# Patient Record
Sex: Female | Born: 1995
Health system: Southern US, Academic
[De-identification: ages and names within clinical notes are randomized; demographics above are authoritative.]

## PROBLEM LIST (undated history)

## (undated) ENCOUNTER — Inpatient Hospital Stay: Payer: Self-pay

## (undated) ENCOUNTER — Encounter

## (undated) ENCOUNTER — Ambulatory Visit: Payer: Medicaid (Managed Care)

## (undated) ENCOUNTER — Encounter: Attending: Physician Assistant | Primary: Physician Assistant

## (undated) ENCOUNTER — Telehealth

## (undated) ENCOUNTER — Ambulatory Visit

## (undated) ENCOUNTER — Ambulatory Visit: Attending: Otolaryngology | Primary: Otolaryngology

## (undated) ENCOUNTER — Ambulatory Visit: Payer: Medicaid (Managed Care) | Attending: Physician Assistant | Primary: Physician Assistant

## (undated) ENCOUNTER — Ambulatory Visit: Payer: PRIVATE HEALTH INSURANCE

## (undated) ENCOUNTER — Encounter: Attending: Primary Care | Primary: Primary Care

## (undated) ENCOUNTER — Inpatient Hospital Stay: Payer: PRIVATE HEALTH INSURANCE

## (undated) ENCOUNTER — Ambulatory Visit: Payer: MEDICAID

## (undated) ENCOUNTER — Encounter: Attending: Dermatology | Primary: Dermatology

## (undated) DIAGNOSIS — R011 Cardiac murmur, unspecified: Secondary | ICD-10-CM

## (undated) DIAGNOSIS — D649 Anemia, unspecified: Secondary | ICD-10-CM

## (undated) HISTORY — PX: NO PAST SURGERIES: SHX2092

---
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
## 2014-03-16 NOTE — L&D Delivery Note (Addendum)
Delivery Note  First Stage: Cervidil IOL for BMI >40 on 11/28/14 Active Labor onset: 1841 Augmentation : AROM and Pitocin  Analgesia /Anesthesia intrapartum: Epidural  AROM at 1841 clear fluid  Second Stage: Complete dilation at 2105 Onset of pushing at 2105 FHR second stage: Category 1: 125bmp/moderate variability/+accels/no decels  Delivery of a viable female at 2123 by CNM in ROA position No nuchal cord Cord double clamped after cessation of pulsation, cut by CNM  Third Stage: Placenta delivered via Tomasa Blase intact with 3 VC @ 2131 Placenta disposition: hospital disposal Uterine tone firm / bleeding minimal  Bilateral labial lacerations identified with minimal oozing Anesthesia for repair: Lidocaine and Epidural Repair: 3'0 vicryl rapide Est. Blood Loss (mL): 250  Complications: None  Mom to postpartum.  Baby to Couplet care / Skin to Skin.  Newborn: Birth Weight: 7#4.4oz  Apgar Scores: 7, 9 Feeding planned: breast/bottle  Dr. Dalbert Garnet present  and assisted for repair.   Karena Addison, CNM

## 2014-06-11 LAB — OB RESULTS CONSOLE HGB/HCT, BLOOD
HCT: 34 %
HEMOGLOBIN: 11.7 g/dL

## 2014-06-11 LAB — OB RESULTS CONSOLE RPR: RPR: NONREACTIVE

## 2014-06-11 LAB — OB RESULTS CONSOLE ABO/RH: RH Type: POSITIVE

## 2014-06-11 LAB — OB RESULTS CONSOLE ANTIBODY SCREEN: Antibody Screen: NEGATIVE

## 2014-06-11 LAB — OB RESULTS CONSOLE GC/CHLAMYDIA
Chlamydia: NEGATIVE
Gonorrhea: NEGATIVE

## 2014-06-11 LAB — OB RESULTS CONSOLE PLATELET COUNT: Platelets: 263 10*3/uL

## 2014-07-13 ENCOUNTER — Ambulatory Visit
Admit: 2014-07-13 | Disposition: A | Discharge status: Home / Self Care | Payer: Self-pay | Attending: Advanced Practice Midwife | Admitting: Advanced Practice Midwife

## 2014-07-26 ENCOUNTER — Ambulatory Visit: Payer: Self-pay

## 2014-08-20 ENCOUNTER — Observation Stay
Admission: EM | Admit: 2014-08-20 | Discharge: 2014-08-20 | Disposition: A | Discharge status: Home / Self Care | Payer: Medicaid Other | Attending: Obstetrics and Gynecology | Admitting: Obstetrics and Gynecology

## 2014-08-20 DIAGNOSIS — Z3490 Encounter for supervision of normal pregnancy, unspecified, unspecified trimester: Secondary | ICD-10-CM

## 2014-08-20 DIAGNOSIS — O26892 Other specified pregnancy related conditions, second trimester: Secondary | ICD-10-CM | POA: Diagnosis not present

## 2014-08-20 DIAGNOSIS — Z3A22 22 weeks gestation of pregnancy: Secondary | ICD-10-CM | POA: Diagnosis not present

## 2014-08-20 NOTE — Progress Notes (Signed)
Patient ID: Bethany Velez, female   DOB: 1995-12-19, 19 y.o.   MRN: [id number]  S: G2P0010 at 25+2wks presenting for r/o preterm premature rupture of membranes after gush of fluid today while walking. No further fluid draining since that time. Good FM. Last intercourse 2 days ago. No vaginal bleeding. Is noting an unusual odor as well.  Ceasar Mons: Filed Vitals:   08/20/14 1639  Temp: 97.9 F (36.6 C)  TempSrc: Oral  Resp: 18  Height: 5\' 2"  (1.575 m)    sterile cervical exam: Closed/thick/high/posterior and firm  Sterile speculum exam: No pooling noted. Physiologic discharge noted. Cervix appears closed.  Wet mount by microscopy by physician: No ferning, nitrazine negative. No trichamonads No pseudohyphae No sperm No clue cells noted  NST- None reactive, reassuring for fetal gestation. No contractions noted.  A: 19yo G2P0010 at 25 wks, intact membranes, not in labor P: Discharge home with precautions. Return to clinic as scheduled

## 2014-08-21 ENCOUNTER — Encounter (HOSPITAL_COMMUNITY): Payer: Self-pay

## 2014-09-07 ENCOUNTER — Other Ambulatory Visit: Payer: Self-pay | Admitting: Advanced Practice Midwife

## 2014-09-07 DIAGNOSIS — Z3689 Encounter for other specified antenatal screening: Secondary | ICD-10-CM

## 2014-09-07 DIAGNOSIS — R011 Cardiac murmur, unspecified: Secondary | ICD-10-CM

## 2014-10-01 ENCOUNTER — Ambulatory Visit (HOSPITAL_BASED_OUTPATIENT_CLINIC_OR_DEPARTMENT_OTHER)
Admission: RE | Admit: 2014-10-01 | Discharge: 2014-10-01 | Disposition: A | Discharge status: Home / Self Care | Payer: Medicaid Other | Source: Ambulatory Visit | Attending: Advanced Practice Midwife | Admitting: Advanced Practice Midwife

## 2014-10-01 ENCOUNTER — Ambulatory Visit
Admission: RE | Admit: 2014-10-01 | Discharge: 2014-10-01 | Disposition: A | Discharge status: Home / Self Care | Payer: Medicaid Other | Source: Ambulatory Visit | Attending: Maternal & Fetal Medicine | Admitting: Maternal & Fetal Medicine

## 2014-10-01 DIAGNOSIS — Z6841 Body Mass Index (BMI) 40.0 and over, adult: Secondary | ICD-10-CM | POA: Diagnosis not present

## 2014-10-01 DIAGNOSIS — O359XX0 Maternal care for (suspected) fetal abnormality and damage, unspecified, not applicable or unspecified: Secondary | ICD-10-CM

## 2014-10-01 DIAGNOSIS — Z87891 Personal history of nicotine dependence: Secondary | ICD-10-CM | POA: Diagnosis not present

## 2014-10-01 DIAGNOSIS — O99213 Obesity complicating pregnancy, third trimester: Secondary | ICD-10-CM | POA: Diagnosis not present

## 2014-10-01 DIAGNOSIS — O99210 Obesity complicating pregnancy, unspecified trimester: Secondary | ICD-10-CM

## 2014-10-01 DIAGNOSIS — R011 Cardiac murmur, unspecified: Secondary | ICD-10-CM | POA: Diagnosis not present

## 2014-10-01 DIAGNOSIS — E669 Obesity, unspecified: Secondary | ICD-10-CM | POA: Diagnosis not present

## 2014-10-01 DIAGNOSIS — Z3A31 31 weeks gestation of pregnancy: Secondary | ICD-10-CM | POA: Diagnosis not present

## 2014-10-01 DIAGNOSIS — Z3689 Encounter for other specified antenatal screening: Secondary | ICD-10-CM

## 2014-10-01 HISTORY — DX: Cardiac murmur, unspecified: R01.1

## 2014-10-01 LAB — US OB DETAIL + 14 WK

## 2014-10-01 NOTE — Progress Notes (Signed)
[facility] Consultation   Chief Complaint: Morbid obesity, innocent heart murmur.  HPI: Ms. Bethany Velez is a 19 y.o. G1P0000 at 1810w2d by 8 week 5 day [facility] who presents in consultation from  for history of heart murmur and morbid obesity.  Gynecologic History:  Irregular menses  Past Medical History: Patient  has a past medical history of Anemia and Heart murmur (19 years old). According to the patient [facility] and was told that the heart murmur was not a problem.  Past Surgical History: No operations.  Medications: Prenatal vitamins and iron.   Allergies: Patient has No Known Allergies.   Social History: Patient  reports that she has quit smoking. She has never used smokeless tobacco. She reports that she does not drink alcohol or use illicit drugs.   Family History: Negative  Review of Systems A full 12 point review of systems was negative.   Physical Exam: BP 125/76 Wt = 234 lbs BMI today 42.8  US today - BREECH, EFW 1797 g, 48th %tile, AFI - 15.4 cm  Asessement: 19 yo gravida 1 at 31 weeks 2 days gestation with BMI > 40   Recommendations Now:  Because of her BMI and reported snoring, we would recommend a sleep study.  We have scheduled her to return for follow-up ultrasound for growth in 4-5 weeks.   We would also recommend she establish care with her OBGYN now, rather than awaiting labor.   We would recommend an anesthesia consult.   We would recommend weekly antepartum testing starting at [redacted] weeks gestation and delivery by [redacted] weeks gestation. If she develops preeclampsia or other complication, the intensity of surveillance would need to be increased. At delivery:  We would recommend pneumatic compression devices in labor and postpartum.  We would recommend consideration of 3 gm instead of 2 gm of cefazolin for prophylaxis at the time of a cesarean delivery.  At the time of a cesarean delivery, we  would recommend application of a negative pressure dressing. Postpartum:  We would recommend referral for weight loss management.   Total time spent with the patient was 40 minutes with greater than 50% spent in counseling and coordination of care.  We will be happy to be involved in the ongoing care of Bethany Velez as her team desires.  Argentina PonderAndra H. Ancel Easler, MD [facility]

## 2014-10-01 NOTE — Consult Note (Signed)
[facility] Consultation   Chief Complaint: Morbid obesity, innocent heart murmur.  HPI: Ms. Bethany Velez is a 19 y.o. G1P0000 at [redacted]w[redacted]d by 8 week 5 day ultrasound performed at [facility] who presents in consultation from  for history of heart murmur and morbid obesity.  Gynecologic History:  Irregular menses  Past Medical History: Patient  has a past medical history of Anemia and Heart murmur (19 years old). According to the patient she was seen at [facility] and was told that the heart murmur was not a problem.  Past Surgical History: No operations.  Medications: Prenatal vitamins and iron.   Allergies: Patient has No Known Allergies.   Social History: Patient  reports that she has quit smoking. She has never used smokeless tobacco. She reports that she does not drink alcohol or use illicit drugs.   Family History: Negative  Review of Systems A full 12 point review of systems was negative.   Physical Exam: BP 125/76 Wt = 234 lbs BMI today 42.8  US today - BREECH, EFW 1797 g, 48th %tile, AFI - 15.4 cm  Asessement: 19 yo gravida 1 at 31 weeks 2 days gestation with BMI > 40   Recommendations Now:  Because of her BMI and reported snoring, we would recommend a sleep study.  We have scheduled her to return for follow-up ultrasound for growth in 4-5 weeks.   We would also recommend she establish care with her OBGYN now, rather than awaiting labor.   We would recommend an anesthesia consult.   We would recommend weekly antepartum testing starting at [redacted] weeks gestation and delivery by [redacted] weeks gestation. If she develops preeclampsia or other complication, the intensity of surveillance would need to be increased. At delivery:  We would recommend pneumatic compression devices in labor and postpartum.  We would recommend consideration of 3 gm instead of 2 gm of cefazolin for prophylaxis at the time of a cesarean delivery.  At the time of a cesarean delivery, we  would recommend application of a negative pressure dressing. Postpartum:  We would recommend referral for weight loss management.   Total time spent with the patient was 40 minutes with greater than 50% spent in counseling and coordination of care.  We will be happy to be involved in the ongoing care of Bethany Velez as her team desires.  Baili Stang H. Shahad Mazurek, MD [facility] 

## 2014-10-17 ENCOUNTER — Observation Stay
Admission: EM | Admit: 2014-10-17 | Discharge: 2014-10-17 | Disposition: A | Discharge status: Home / Self Care | Payer: Medicaid Other | Source: Other Acute Inpatient Hospital | Attending: Obstetrics and Gynecology | Admitting: Obstetrics and Gynecology

## 2014-10-17 ENCOUNTER — Encounter: Payer: Self-pay | Admitting: *Deleted

## 2014-10-17 DIAGNOSIS — R102 Pelvic and perineal pain: Secondary | ICD-10-CM | POA: Diagnosis present

## 2014-10-17 DIAGNOSIS — Z3A33 33 weeks gestation of pregnancy: Secondary | ICD-10-CM | POA: Diagnosis not present

## 2014-10-17 DIAGNOSIS — O26893 Other specified pregnancy related conditions, third trimester: Principal | ICD-10-CM | POA: Insufficient documentation

## 2014-10-17 HISTORY — DX: Anemia, unspecified: D64.9

## 2014-10-17 NOTE — OB Triage Note (Signed)
Pt arrived to [facility] with complaint of pressure in her perineal area since yesterday.  Pt states that she called [facility] and they told her to come to [facility] for assessment.  Ellison Carwin RNC

## 2014-10-17 NOTE — Discharge Instructions (Signed)
Braxton Hicks Contractions °Contractions of the uterus can occur throughout pregnancy. Contractions are not always a sign that you are in labor.  °WHAT ARE BRAXTON HICKS CONTRACTIONS?  °Contractions that occur before labor are called Braxton Hicks contractions, or false labor. Toward the end of pregnancy (32-34 weeks), these contractions can develop more often and may become more forceful. This is not true labor because these contractions do not result in opening (dilatation) and thinning of the cervix. They are sometimes difficult to tell apart from true labor because these contractions can be forceful and people have different pain tolerances. You should not feel embarrassed if you go to the hospital with false labor. Sometimes, the only way to tell if you are in true labor is for your health care provider to look for changes in the cervix. °If there are no prenatal problems or other health problems associated with the pregnancy, it is completely safe to be sent home with false labor and await the onset of true labor. °HOW CAN YOU TELL THE DIFFERENCE BETWEEN TRUE AND FALSE LABOR? °False Labor °· The contractions of false labor are usually shorter and not as hard as those of true labor.   °· The contractions are usually irregular.   °· The contractions are often felt in the front of the lower abdomen and in the groin.   °· The contractions may go away when you walk around or change positions while lying down.   °· The contractions get weaker and are shorter lasting as time goes on.   °· The contractions do not usually become progressively stronger, regular, and closer together as with true labor.   °True Labor °· Contractions in true labor last 30-70 seconds, become very regular, usually become more intense, and increase in frequency.   °· The contractions do not go away with walking.   °· The discomfort is usually felt in the top of the uterus and spreads to the lower abdomen and low back.   °· True labor can be  determined by your health care provider with an exam. This will show that the cervix is dilating and getting thinner.   °WHAT TO REMEMBER °· Keep up with your usual exercises and follow other instructions given by your health care provider.   °· Take medicines as directed by your health care provider.   °· Keep your regular prenatal appointments.   °· Eat and drink lightly if you think you are going into labor.   °· If Braxton Hicks contractions are making you uncomfortable:   °¨ Change your position from lying down or resting to walking, or from walking to resting.   °¨ Sit and rest in a tub of warm water.   °¨ Drink 2-3 glasses of water. Dehydration may cause these contractions.   °¨ Do slow and deep breathing several times an hour.   °WHEN SHOULD I SEEK IMMEDIATE MEDICAL CARE? °Seek immediate medical care if: °· Your contractions become stronger, more regular, and closer together.   °· You have fluid leaking or gushing from your vagina.   °· You have a fever.   °· You pass blood-tinged mucus.   °· You have vaginal bleeding.   °· You have continuous abdominal pain.   °· You have low back pain that you never had before.   °· You feel your baby's head pushing down and causing pelvic pressure.   °· Your baby is not moving as much as it used to.   °Document Released: 03/02/2005 Document Revised: 03/07/2013 Document Reviewed: 12/12/2012 °Exit[employer]® Patient Information ©2015 [employer]. This information is not intended to replace advice given to you by your health care   provider. Make sure you discuss any questions you have with your health care provider. ° °

## 2014-10-17 NOTE — OB Triage Provider Note (Signed)
CC: vaginal pressure  LMP: unsure EDD: 12/01/14 by 8.5wk Higinio Roger G1P0 @ 33+4wks here for one day h/o vaginal pressure. No pain, no ctx, no lof, no vb. No dysuria. No other complaints. +FM  APC: [facility] (plans to come to [place] at 36wks) 1. Obesity (BMI 44) - NSTs at 36wk - delivery by 40wks - will need SCD in labor and 3g ancef if CS  2. H/o heart murmur (age 19yo) - [facility] in past - not an issue this pregnancy  Labs:  06/14/14 - hb 10.4,  05/18/14 - antibody neg, A+, RPR nr, GC/CT neg, Ucx neg, HIV neg, GCT 101, HepBsag neg, PL 263 Utox neg  Sono: 04/26/14 - TVUS 8 5/7, 9ovaries wnl,  10/01/14 - BREECH, EFW 1797 g, 48th %tile, AFI - 15.4 cm  Gynecologic History:  Irregular menses  Past Medical History: Patient has a past medical history of Anemia and Heart murmur (18 years old). According to the patient she was seen at [facility] and was told that the heart murmur was not a problem.  Past Surgical History: No operations.  Medications: Prenatal vitamins and iron.   Allergies: Patient has No Known Allergies.   Social History: Patient reports that she has quit smoking. She has never used smokeless tobacco. She reports that she does not drink alcohol or use illicit drugs.   Family History: Negative  Review of Systems A full 12 point review of systems was negative.      PE: BP 118/73 mmHg  Pulse 96  Temp(Src) 98.6 F (37 C) (Oral)  Ht  (1.575 m)  Wt 105.688 kg (233 lb)  BMI 42.61 kg/m2  Physical Exam  Constitutional: She is oriented to person, place, and time. She appears well-developed and well-nourished.  Cardiovascular: Normal rate.   Pulmonary/Chest: Effort normal.  Abdominal: Soft.  Neurological: She is alert and oriented to person, place, and time.  Skin: Skin is warm and dry.  Psychiatric: She has a normal mood and affect. Her behavior is normal.   SVE: external 1cm, internal closed, soft, posterior  EFM: 130 mod var, + accels no decels Toco:  irritable, no discernible ctx  A/P: 19yo G1P0 @ 33+4wks here with vaginal pressure, closed internal cervix, no ctx on toco. Reactive tracing. Patient given strict preterm labor precautions. [facility].  Ala Dach, MD

## 2014-10-29 ENCOUNTER — Ambulatory Visit
Admission: RE | Admit: 2014-10-29 | Discharge: 2014-10-29 | Disposition: A | Discharge status: Home / Self Care | Payer: Medicaid Other | Source: Ambulatory Visit | Attending: Maternal & Fetal Medicine | Admitting: Maternal & Fetal Medicine

## 2014-10-29 DIAGNOSIS — Z3A35 35 weeks gestation of pregnancy: Secondary | ICD-10-CM | POA: Insufficient documentation

## 2014-10-29 DIAGNOSIS — Z3689 Encounter for other specified antenatal screening: Secondary | ICD-10-CM

## 2014-10-29 DIAGNOSIS — O99213 Obesity complicating pregnancy, third trimester: Secondary | ICD-10-CM | POA: Insufficient documentation

## 2014-10-29 DIAGNOSIS — R011 Cardiac murmur, unspecified: Secondary | ICD-10-CM | POA: Insufficient documentation

## 2014-10-29 LAB — US MFM OB FOLLOW UP

## 2014-10-31 DIAGNOSIS — Z8679 Personal history of other diseases of the circulatory system: Secondary | ICD-10-CM | POA: Insufficient documentation

## 2014-11-05 ENCOUNTER — Ambulatory Visit
Admission: RE | Admit: 2014-11-05 | Discharge: 2014-11-05 | Disposition: A | Discharge status: Home / Self Care | Payer: Medicaid Other | Source: Ambulatory Visit | Attending: Maternal & Fetal Medicine | Admitting: Maternal & Fetal Medicine

## 2014-11-05 ENCOUNTER — Ambulatory Visit: Payer: Medicaid Other

## 2014-11-05 DIAGNOSIS — O99213 Obesity complicating pregnancy, third trimester: Secondary | ICD-10-CM | POA: Diagnosis not present

## 2014-11-05 DIAGNOSIS — O99210 Obesity complicating pregnancy, unspecified trimester: Secondary | ICD-10-CM

## 2014-11-05 DIAGNOSIS — Z3A36 36 weeks gestation of pregnancy: Secondary | ICD-10-CM | POA: Insufficient documentation

## 2014-11-05 DIAGNOSIS — Z6841 Body Mass Index (BMI) 40.0 and over, adult: Secondary | ICD-10-CM | POA: Diagnosis not present

## 2014-11-21 ENCOUNTER — Inpatient Hospital Stay: Admission: RE | Admit: 2014-11-21 | Payer: Medicaid Other | Source: Ambulatory Visit

## 2014-11-28 ENCOUNTER — Inpatient Hospital Stay
Admission: RE | Admit: 2014-11-28 | Discharge: 2014-12-01 | DRG: 775 | Disposition: A | Discharge status: Home / Self Care | Payer: Medicaid Other | Attending: Obstetrics and Gynecology | Admitting: Obstetrics and Gynecology

## 2014-11-28 DIAGNOSIS — O99214 Obesity complicating childbirth: Principal | ICD-10-CM | POA: Diagnosis present

## 2014-11-28 DIAGNOSIS — Z3A39 39 weeks gestation of pregnancy: Secondary | ICD-10-CM | POA: Diagnosis present

## 2014-11-28 DIAGNOSIS — Z87891 Personal history of nicotine dependence: Secondary | ICD-10-CM | POA: Diagnosis not present

## 2014-11-28 DIAGNOSIS — O99213 Obesity complicating pregnancy, third trimester: Secondary | ICD-10-CM | POA: Diagnosis present

## 2014-11-28 DIAGNOSIS — Z6841 Body Mass Index (BMI) 40.0 and over, adult: Secondary | ICD-10-CM | POA: Diagnosis not present

## 2014-11-28 MED ORDER — LACTATED RINGERS IV SOLN
500.0000 mL | INTRAVENOUS | Status: DC | PRN
  Administered 2014-11-29: 1000 mL via INTRAVENOUS

## 2014-11-29 ENCOUNTER — Inpatient Hospital Stay: Payer: Medicaid Other | Admitting: Registered Nurse

## 2014-11-29 ENCOUNTER — Encounter: Payer: Self-pay | Admitting: *Deleted

## 2014-11-29 ENCOUNTER — Other Ambulatory Visit: Payer: Self-pay | Admitting: Obstetrics and Gynecology

## 2014-11-29 LAB — CBC
HEMATOCRIT: 34.2 % — AB (ref 35.0–47.0)
Hemoglobin: 11.4 g/dL — ABNORMAL LOW (ref 12.0–16.0)
MCH: 27.5 pg (ref 26.0–34.0)
MCHC: 33.5 g/dL (ref 32.0–36.0)
MCV: 82 fL (ref 80.0–100.0)
Platelets: 223 10*3/uL (ref 150–440)
RBC: 4.17 MIL/uL (ref 3.80–5.20)
RDW: 14.5 % (ref 11.5–14.5)
WBC: 10.6 10*3/uL (ref 3.6–11.0)

## 2014-11-29 LAB — TYPE AND SCREEN
ABO/RH(D): A POS
ANTIBODY SCREEN: NEGATIVE

## 2014-11-29 LAB — CHLAMYDIA/NGC RT PCR (ARMC ONLY)
CHLAMYDIA TR: NOT DETECTED
N GONORRHOEAE: NOT DETECTED

## 2014-11-29 MED ORDER — BUTORPHANOL TARTRATE 1 MG/ML IJ SOLN: 1.0000 mg | INTRAMUSCULAR | Status: DC | PRN

## 2014-11-29 MED ORDER — MISOPROSTOL 200 MCG PO TABS
ORAL_TABLET | ORAL | Status: AC
  Filled 2014-11-29: qty 4

## 2014-11-29 MED ORDER — LACTATED RINGERS IV SOLN
INTRAVENOUS | Status: DC
  Administered 2014-11-29 (×3): via INTRAVENOUS

## 2014-11-29 MED ORDER — OXYTOCIN 40 UNITS IN LACTATED RINGERS INFUSION - SIMPLE MED
1.0000 m[IU]/min | INTRAVENOUS | Status: DC
  Administered 2014-11-29: 1 m[IU]/min via INTRAVENOUS

## 2014-11-29 MED ORDER — ONDANSETRON HCL 4 MG/2ML IJ SOLN
4.0000 mg | Freq: Four times a day (QID) | INTRAMUSCULAR | Status: DC | PRN
  Administered 2014-11-29: 4 mg via INTRAVENOUS
  Filled 2014-11-29: qty 2

## 2014-11-29 MED ORDER — BUTORPHANOL TARTRATE 1 MG/ML IJ SOLN
2.0000 mg | INTRAMUSCULAR | Status: DC | PRN
  Administered 2014-11-29 (×4): 2 mg via INTRAVENOUS
  Filled 2014-11-29 (×3): qty 2

## 2014-11-29 MED ORDER — OXYTOCIN 40 UNITS IN LACTATED RINGERS INFUSION - SIMPLE MED
INTRAVENOUS | Status: AC
  Filled 2014-11-29: qty 1000

## 2014-11-29 MED ORDER — OXYTOCIN 40 UNITS IN LACTATED RINGERS INFUSION - SIMPLE MED: 62.5000 mL/h | INTRAVENOUS | Status: DC

## 2014-11-29 MED ORDER — LACTATED RINGERS IV SOLN: 500.0000 mL | INTRAVENOUS | Status: DC | PRN

## 2014-11-29 MED ORDER — SODIUM CHLORIDE 0.9 % IJ SOLN
INTRAMUSCULAR | Status: AC
  Filled 2014-11-29: qty 10

## 2014-11-29 MED ORDER — ACETAMINOPHEN 325 MG PO TABS: 650.0000 mg | ORAL_TABLET | ORAL | Status: DC | PRN

## 2014-11-29 MED ORDER — LIDOCAINE-EPINEPHRINE (PF) 1.5 %-1:200000 IJ SOLN
INTRAMUSCULAR | Status: DC | PRN
  Administered 2014-11-29: 3 mL via PERINEURAL
  Administered 2014-11-29: 2 mL via PERINEURAL

## 2014-11-29 MED ORDER — BUTORPHANOL TARTRATE 1 MG/ML IJ SOLN
INTRAMUSCULAR | Status: AC
  Administered 2014-11-29: 2 mg via INTRAVENOUS
  Filled 2014-11-29: qty 2

## 2014-11-29 MED ORDER — LIDOCAINE HCL (PF) 1 % IJ SOLN
INTRAMUSCULAR | Status: AC
  Filled 2014-11-29: qty 30

## 2014-11-29 MED ORDER — LIDOCAINE HCL (PF) 1 % IJ SOLN
30.0000 mL | INTRAMUSCULAR | Status: DC | PRN
  Filled 2014-11-29: qty 30

## 2014-11-29 MED ORDER — FENTANYL 2.5 MCG/ML W/ROPIVACAINE 0.2% IN NS 100 ML EPIDURAL INFUSION (ARMC-ANES)
EPIDURAL | Status: AC
  Administered 2014-11-29: 10 mL/h via EPIDURAL
  Filled 2014-11-29: qty 100

## 2014-11-29 MED ORDER — OXYTOCIN BOLUS FROM INFUSION: 500.0000 mL | INTRAVENOUS | Status: DC

## 2014-11-29 MED ORDER — CITRIC ACID-SODIUM CITRATE 334-500 MG/5ML PO SOLN: 30.0000 mL | ORAL | Status: DC | PRN

## 2014-11-29 MED ORDER — DINOPROSTONE 10 MG VA INST
10.0000 mg | VAGINAL_INSERT | Freq: Once | VAGINAL | Status: AC
  Administered 2014-11-29: 10 mg via VAGINAL
  Filled 2014-11-29: qty 1

## 2014-11-29 MED ORDER — BUPIVACAINE HCL (PF) 0.25 % IJ SOLN
INTRAMUSCULAR | Status: DC | PRN
  Administered 2014-11-29 (×2): 4 mL via EPIDURAL

## 2014-11-29 MED ORDER — TERBUTALINE SULFATE 1 MG/ML IJ SOLN: 0.2500 mg | Freq: Once | INTRAMUSCULAR | Status: DC | PRN

## 2014-11-29 MED ORDER — AMMONIA AROMATIC IN INHA
RESPIRATORY_TRACT | Status: AC
  Filled 2014-11-29: qty 10

## 2014-11-29 NOTE — Progress Notes (Signed)
In room to reposition pt, when on left side, epidural level at T9, after inserting foley and repositioning to right side, pt level much higher, c/o not able to get deep breath. Pt repositioned to high fowlers and anesthesia notified. O2 sats 98%, BP 125/54. Per Dr. Pernell Dupre, decrease epidural rate to 1 ml/hour for 30 minutes, then return to 48ml/hour.

## 2014-11-29 NOTE — Progress Notes (Signed)
S:  Cervidil removed per nurse at 12      Ambulating in hall       Receiving intermittent Stadol for pain, rating it 7/10       Pt. Is considering an epidural  O:  VS: Blood pressure 126/69, pulse 109, temperature 97.7 F (36.5 C), temperature source Oral, resp. rate 18, height  (1.575 m), weight 107.049 kg (236 lb), SpO2 98 %.        FHR : baseline 120bmp / variability Moderate / accelerations +15x15 / no decelerations        Toco: contractions every 2-4 minutes / Moderate to palpation        Cervix : Dilation: 4 Effacement (%): 60 Cervical Position: Posterior Station: -3 Presentation: Vertex Exam by:: Joelene Millin, CNM        Membranes: Intact  A: Latent labor     FHR category 1  P: Begin Pitocin augmentation      May have epidural upon request     Reassess in 1-2 hours      Anticipate NSVD   Carlean Jews, CNM

## 2014-11-29 NOTE — Anesthesia Procedure Notes (Signed)
Epidural Patient location during procedure: OB Start time: 11/29/2014 2:48 PM End time: 11/29/2014 3:02 PM  Staffing Resident/CRNA: Stormy Fabian Performed by: resident/CRNA   Preanesthetic Checklist Completed: patient identified, site marked, surgical consent, pre-op evaluation, timeout performed, IV checked, risks and benefits discussed and monitors and equipment checked  Epidural Patient position: sitting Prep: Betadine Patient monitoring: heart rate, continuous pulse ox and blood pressure Approach: midline Location: L4-L5 Injection technique: LOR saline  Needle:  Needle type: Tuohy  Needle gauge: 18 G Needle length: 9 cm and 9 Needle insertion depth: 7 cm Catheter type: closed end flexible Catheter size: 20 Guage Catheter at skin depth: 11 cm Test dose: negative and 1.5% lidocaine with Epi 1:200 K  Assessment Events: blood not aspirated, injection not painful, no injection resistance, negative IV test and no paresthesia  Additional Notes   Patient tolerated the insertion well without complications.Reason for block:procedure for pain

## 2014-11-29 NOTE — Progress Notes (Signed)
S:  Doing well, minimal rest overnight       Rates pain 7/10 with Stadol, but tolerating well / does not want epidural during labor  O:  VS: Blood pressure 117/70, pulse 83, temperature 98.7 F (37.1 C), temperature source Oral, resp. rate 16, height  (1.575 m), weight 107.049 kg (236 lb).       General: alert, oriented x 3, NAD        Lungs: clear, equal bilaterally,         Heart: RRR        Abdomen: obese, gravid, non-tender, +BS        Lower Extremities: no edema, +2DTRs, no clonus        FHR : baseline 125/ variability Moderate / accelerations + 15x15 / no decelerations        Toco: contractions every 2-4 minutes /Quality: Mild-Moderate        Cervix : Dilation: 3 Effacement (%): 60 Cervical Position: Posterior Station: -2 Presentation: Vertex Exam by:: HFC        Membranes: Intact  A: Latent labor     IOL      Obesity     FHR category 1  P: Continue Cervidil induction - plan to remove around 12pm today      Anesthesia consult this morning      Reassess in 2-3 hours      Anticipate NSVD   Karena Addison, CNM

## 2014-11-29 NOTE — Progress Notes (Signed)
S:  Pt. Comfortable with epidural       Had an episode causing her to feel like she couldn't catch her breath - anesthesia notified by RN and adjusted epidural and now feels better        Discussed risks/benefits of AROM and pt. Agrees   O:  VS: Blood pressure 113/61, pulse 102, temperature 98.3 F (36.8 C), temperature source Oral, resp. rate 18, height  (1.575 m), weight 107.049 kg (236 lb), SpO2 96 %.        FHR : baseline 125bmp / variability moderate / accelerations + 15x15 / occasional early/variable decelerations        Toco: contractions every 1.5-2 minutes / Mild-Moderate        Cervix : Dilation: 6 Effacement (%): 80 Cervical Position: Posterior Station: 0 Presentation: Vertex Exam by:: MSigmon        Membranes: AROM @ 1841 small amount of clear fluid        Pitocin at 7 milliunits  A: Active labor     FHR category 2  P: Continue pitocin augmentation      Reassess in 1-2 hours      Anticipate NSVD   Karena Addison, CNM

## 2014-11-29 NOTE — H&P (Signed)
OB PHYSICIAN IOL ADMISSION NOTE  LMP: unsure EDD: 12/01/14 by 8.5wk sono  HPI: 19yo G1P0 @ 39+5wks here for IOL for morbid obesity.   APC: [facility] --> [facility] 1. Obesity BMI 44 - s/p [facility] consult - Weekly NST/AFI starting at 36wks - Monthly growth sonos, last 8/15 - Induction by 40wks  anesthesia consult (need to see in house)  sleep study ([facility] is arranging [facility] notes)  weight loss management referral   2. Abdominal furnacles - s/p I&D  3. H/o heart murmur as child - s/p [facility] cards w/u, nl ECHO 2013  Labs:  3/3 - A+, ab neg, 11.7/34.3<263, RPR NR, GC/CT neg, Ucx neg, HIV neg, GCT 101, Hbsag neg, RI, VZV I Utox neg, GC/CT neg 7/7 - RPR NR, GCT 91, HIV neg,  7/30 - P:C 177, Hb 11.6 8/24 - GC/CT neg, GBS neg, RPR neg 8/26 - Ecoli UTI, s/p treatment and TOC  Sono:  04/26/14 - TVUS 8 5/7, 9ovaries wnl,  10/01/14 - BREECH, EFW 1797 g, 48th %tile, AFI - 15.4 cm 8/15: EFW 2659g (50%), VTX, AFI 10.4, post placenta, 35+[redacted]wks GA, normal anatomy  Gynecologic History:  Irregular menses  Past Medical History: Patient has a past medical history of Anemia and Heart murmur (19 years old). According to the patient she was seen at [facility] and was told that the heart murmur was not a problem.  Past Surgical History: No operations.  Medications: Prenatal vitamins and iron.   Allergies: Patient has No Known Allergies.   Social History: Patient reports that she has quit smoking. She has never used smokeless tobacco. She reports that she does not drink alcohol or use illicit drugs.   Family History: Negative  Review of Systems A full 12 point review of systems was negative.   O: BP 120/78 mmHg  Pulse 89  Temp(Src) 98 F (36.7 C) (Oral)  Resp 16  Ht  (1.575 m)  Wt 107.049 kg (236 lb)  BMI 43.15 kg/m2   EFM: 130 mod var, +accels, no decels Toco: Q11min  A/P: 19yo G1P0 @ 39+5wks here for IOL for morbid obesity.   1. Morbid obesity - anesthesia consult in  morning  2. IOL - cervadil overnight  To be evaluated in the morning  Ala Dach, MD

## 2014-11-29 NOTE — Progress Notes (Signed)
S: Just received epidural and is much more comfortable and planning to take a nap  O:  VS: Blood pressure 126/69, pulse 109, temperature 97.7 F (36.5 C), temperature source Oral, resp. rate 18, height  (1.575 m), weight 107.049 kg (236 lb), SpO2 98 %.        FHR : baseline 135 / variability Moderate / accelerations + 15x15 / no decelerations        Toco: contractions every 2-68minutes / Moderate         Cervix : Dilation: 4 Effacement (%): 60 Cervical Position: Posterior Station: -3 Presentation: Vertex Exam by:: M. Sigmon, CNM        Membranes: Intact        Pitocin at 3 milliunits   A: Latent labor     FHR category 1  P: Continue Pitocin augmentation      Reassess in 1-2 hours      Anticipate NSVD  Carlean Jews, CNM

## 2014-11-29 NOTE — Anesthesia Preprocedure Evaluation (Signed)
Anesthesia Evaluation  Patient identified by MRN, date of birth, ID band Patient awake    Reviewed: Allergy & Precautions, H&P , NPO status , Patient's Chart, lab work & pertinent test results  History of Anesthesia Complications Negative for: history of anesthetic complications  Airway Mallampati: II  TM Distance: >3 FB Neck ROM: full    Dental   Pulmonary former smoker,    Pulmonary exam normal        Cardiovascular Normal cardiovascular exam  Murmur at 19 yrs old   Neuro/Psych negative neurological ROS  negative psych ROS   GI/Hepatic negative GI ROS, Neg liver ROS,   Endo/Other  negative endocrine ROS  Renal/GU negative Renal ROS  negative genitourinary   Musculoskeletal   Abdominal   Peds  Hematology  (+) anemia ,   Anesthesia Other Findings   Reproductive/Obstetrics (+) Pregnancy                             Anesthesia Physical Anesthesia Plan  ASA: II  Anesthesia Plan: Epidural   Post-op Pain Management:    Induction:   Airway Management Planned:   Additional Equipment:   Intra-op Plan:   Post-operative Plan:   Informed Consent: I have reviewed the patients History and Physical, chart, labs and discussed the procedure including the risks, benefits and alternatives for the proposed anesthesia with the patient or authorized representative who has indicated his/her understanding and acceptance.     Plan Discussed with: Anesthesiologist  Anesthesia Plan Comments:         Anesthesia Quick Evaluation

## 2014-11-29 NOTE — Progress Notes (Signed)
Patient ID: Bethany Velez, female   DOB: Dec 27, 1995, 19 y.o.   MRN: [id number]  Brief delivery summary - back up attending physician:  Immediately present for delivery I entered the room just after delivery of the baby and found comfortable mom with baby resting quietly on her chest. Bilateral labial lacerations and hymenal ring laceration with bleeding repaired with 2-0 Vicryl Rapide in running fashion.  Fundus firm, minimal bleeding, baby breastfeeding after delivery.  Please see delivery summary by CNM for full details of delivery.

## 2014-11-30 LAB — CBC
HEMATOCRIT: 30.3 % — AB (ref 35.0–47.0)
HEMOGLOBIN: 10.1 g/dL — AB (ref 12.0–16.0)
MCH: 27.5 pg (ref 26.0–34.0)
MCHC: 33.3 g/dL (ref 32.0–36.0)
MCV: 82.7 fL (ref 80.0–100.0)
Platelets: 178 10*3/uL (ref 150–440)
RBC: 3.66 MIL/uL — ABNORMAL LOW (ref 3.80–5.20)
RDW: 14.5 % (ref 11.5–14.5)
WBC: 13.5 10*3/uL — ABNORMAL HIGH (ref 3.6–11.0)

## 2014-11-30 LAB — RPR: RPR Ser Ql: NONREACTIVE

## 2014-11-30 MED ORDER — ACETAMINOPHEN 325 MG PO TABS: 650.0000 mg | ORAL_TABLET | ORAL | Status: DC | PRN

## 2014-11-30 MED ORDER — SENNOSIDES-DOCUSATE SODIUM 8.6-50 MG PO TABS
2.0000 | ORAL_TABLET | ORAL | Status: DC
  Administered 2014-12-01: 2 via ORAL
  Filled 2014-11-30: qty 2

## 2014-11-30 MED ORDER — LANOLIN HYDROUS EX OINT: TOPICAL_OINTMENT | CUTANEOUS | Status: DC | PRN

## 2014-11-30 MED ORDER — BENZOCAINE-MENTHOL 20-0.5 % EX AERO: 1.0000 "application " | INHALATION_SPRAY | CUTANEOUS | Status: DC | PRN

## 2014-11-30 MED ORDER — WITCH HAZEL-GLYCERIN EX PADS: 1.0000 "application " | MEDICATED_PAD | CUTANEOUS | Status: DC | PRN

## 2014-11-30 MED ORDER — IBUPROFEN 600 MG PO TABS
600.0000 mg | ORAL_TABLET | Freq: Four times a day (QID) | ORAL | Status: DC
  Administered 2014-11-30 – 2014-12-01 (×6): 600 mg via ORAL
  Filled 2014-11-30 (×6): qty 1

## 2014-11-30 MED ORDER — PRENATAL MULTIVITAMIN CH
1.0000 | ORAL_TABLET | Freq: Every day | ORAL | Status: DC
  Administered 2014-11-30 – 2014-12-01 (×2): 1 via ORAL
  Filled 2014-11-30 (×2): qty 1

## 2014-11-30 MED ORDER — ONDANSETRON HCL 4 MG/2ML IJ SOLN: 4.0000 mg | INTRAMUSCULAR | Status: DC | PRN

## 2014-11-30 MED ORDER — OXYCODONE-ACETAMINOPHEN 5-325 MG PO TABS
1.0000 | ORAL_TABLET | ORAL | Status: DC | PRN
  Administered 2014-11-30: 1 via ORAL
  Filled 2014-11-30 (×2): qty 1

## 2014-11-30 MED ORDER — ZOLPIDEM TARTRATE 5 MG PO TABS: 5.0000 mg | ORAL_TABLET | Freq: Every evening | ORAL | Status: DC | PRN

## 2014-11-30 MED ORDER — OXYCODONE-ACETAMINOPHEN 5-325 MG PO TABS
2.0000 | ORAL_TABLET | ORAL | Status: DC | PRN
  Administered 2014-11-30: 2 via ORAL
  Filled 2014-11-30: qty 2

## 2014-11-30 MED ORDER — SIMETHICONE 80 MG PO CHEW: 80.0000 mg | CHEWABLE_TABLET | ORAL | Status: DC | PRN

## 2014-11-30 MED ORDER — ONDANSETRON HCL 4 MG PO TABS: 4.0000 mg | ORAL_TABLET | ORAL | Status: DC | PRN

## 2014-11-30 MED ORDER — DIPHENHYDRAMINE HCL 25 MG PO CAPS: 25.0000 mg | ORAL_CAPSULE | Freq: Four times a day (QID) | ORAL | Status: DC | PRN

## 2014-11-30 MED ORDER — DIBUCAINE 1 % RE OINT: 1.0000 "application " | TOPICAL_OINTMENT | RECTAL | Status: DC | PRN

## 2014-11-30 NOTE — Anesthesia Postprocedure Evaluation (Signed)
  Anesthesia Post-op Note  Patient: Bethany Velez  Procedure(s) Performed: * No procedures listed *  Anesthesia type:No value filed.  Patient location: 342  Post pain: Pain level controlled  Post assessment: Post-op Vital signs reviewed, Patient's Cardiovascular Status Stable, Respiratory Function Stable, Patent Airway and No signs of Nausea or vomiting  Post vital signs: Reviewed and stable  Last Vitals:  Filed Vitals:   11/30/14 0743  BP: 94/69  Pulse: 88  Temp: 36.7 C  Resp: 20    Level of consciousness: awake, alert  and patient cooperative  Complications: No apparent anesthesia complications

## 2014-11-30 NOTE — Progress Notes (Signed)
Post Partum Day 1 Subjective: no complaints Breast and bottle feeding well  Objective: Blood pressure 94/69, pulse 88, temperature 98 F (36.7 C), temperature source Oral, resp. rate 20, height  (1.575 m), weight 236 lb (107.049 kg), SpO2 96 %, unknown if currently breastfeeding.  Physical Exam:  General: alert Lochia: appropriate Uterine Fundus: firm Perineum intact DVT Evaluation: Neg Homans   Recent Labs  11/29/14 0001 11/30/14 0631  HGB 11.4* 10.1*  HCT 34.2* 30.3*  WBC 10.6 13.5*  PLT 223 178    Assessment/Plan: Plan for discharge tomorrow   LOS: 2 days   Sharee Pimple 11/30/2014, 10:10 AM

## 2014-12-01 NOTE — Discharge Instructions (Signed)
Care After Vaginal Delivery °Congratulations on your new baby!! ° °Refer to this sheet in the next few weeks. These discharge instructions provide you with information on caring for yourself after delivery. Your caregiver may also give you specific instructions. Your treatment has been planned according to the most current medical practices available, but problems sometimes occur. Call your caregiver if you have any problems or questions after you go home. ° °HOME CARE INSTRUCTIONS °· Take over-the-counter or prescription medicines only as directed by your caregiver or pharmacist. °· Do not drink alcohol, especially if you are breastfeeding or taking medicine to relieve pain. °· Do not chew or smoke tobacco. °· Do not use illegal drugs. °· Continue to use good perineal care. Good perineal care includes: °¨ Wiping your perineum from front to back. °¨ Keeping your perineum clean. °· Do not use tampons or douche until your caregiver says it is okay. °· Shower, wash your hair, and take tub baths as directed by your caregiver. °· Wear a well-fitting bra that provides breast support. °· Eat healthy foods. °· Drink enough fluids to keep your urine clear or pale yellow. °· Eat high-fiber foods such as whole grain cereals and breads, brown rice, beans, and fresh fruits and vegetables every day. These foods may help prevent or relieve constipation. °· Follow your caregiver's recommendations regarding resumption of activities such as climbing stairs, driving, lifting, exercising, or traveling. Specifically, no driving for two weeks, so that you are comfortable reacting quickly in an emergency. °· Talk to your caregiver about resuming sexual activities. Resumption of sexual activities is dependent upon your risk of infection, your rate of healing, and your comfort and desire to resume sexual activity. Usually we recommend waiting about six weeks, or until your bleeding stops and you are interested in sex. °· Try to have someone  help you with your household activities and your newborn for at least a few days after you leave the hospital. Even longer is better. °· Rest as much as possible. Try to rest or take a nap when your newborn is sleeping. Sleep deprivation can be very hard after delivery. °· Increase your activities gradually. °· Keep all of your scheduled postpartum appointments. It is very important to keep your scheduled follow-up appointments. At these appointments, your caregiver will be checking to make sure that you are healing physically and emotionally. ° °SEEK MEDICAL CARE IF:  °· You are passing large clots from your vagina.  °· You have a foul smelling discharge from your vagina. °· You have trouble urinating. °· You are urinating frequently. °· You have pain when you urinate. °· You have a change in your bowel movements. °· You have increasing redness, pain, or swelling near your vaginal incision (episiotomy) or vaginal tear. °· You have pus draining from your episiotomy or vaginal tear. °· Your episiotomy or vaginal tear is separating. °· You have painful, hard, or reddened breasts. °· You have a severe headache. °· You have blurred vision or see spots. °· You feel sad or depressed. °· You have thoughts of hurting yourself or your newborn. °· You have questions about your care, the care of your newborn, or medicines. °· You are dizzy or light-headed. °· You have a rash. °· You have nausea or vomiting. °· You were breastfeeding and have not had a menstrual period within 12 weeks after you stopped breastfeeding. °· You are not breastfeeding and have not had a menstrual period by the 12th week after delivery. °· You   have a fever. ° °SEEK IMMEDIATE MEDICAL CARE IF:  °· You have persistent pain. °· You have chest pain. °· You have shortness of breath. °· You faint. °· You have leg pain. °· You have stomach pain. °· Your vaginal bleeding saturates two or more sanitary pads in 1 hour. ° °MAKE SURE YOU:  °· Understand these  instructions. °· Will get help right away if you are not doing well or get worse. °·  °Document Released: 02/28/2000 Document Revised: 07/17/2013 Document Reviewed: 10/28/2011 ° °ExitCare® Patient Information ©2015 [employer]. This information is not intended to replace advice given to you by your health care provider. Make sure you discuss any questions you have with your health care provider. ° °Call your doctor for increased pain or vaginal bleeding, temperature above 100.4, depression, or concerns.  No strenuous activity or heavy lifting for 6 weeks.  No intercourse, tampons, douching, or enemas for 6 weeks.  No tub baths-showers only.  No driving for 2 weeks or while taking pain medications.  Continue prenatal vitamin and iron. Increase calories and fluids while breastfeeding. ° °

## 2014-12-01 NOTE — Progress Notes (Signed)
Discharge instructions provided.  Pt and sig other verbalize understanding of all instructions and follow-up care via interpreter.  Pt discharged to home with infant at 1740 on 12/01/14 via wheelchair by CNA. Reynold Bowen, RN 12/01/2014 7:39 PM

## 2014-12-01 NOTE — Discharge Summary (Signed)
OB Discharge Summary  Patient Name: Brittani Purdum DOB: 08/29/1995 MRN: [id number] None  Date of admission: 11/28/2014  Date of discharge: 12/01/14   Admitting diagnosis: Induction of labor   Intrauterine pregnancy: [redacted]w[redacted]d  Secondary diagnosis: Obesity     Discharge diagnosis: Stable pp Method of delivery: SVD  Intrapartum Procedures: AROM, Pitocin   Post partum procedures:none  Complications:none  Hospital course:Stable  Physical exam:  General: alert Lochia: moderate Uterine Fundus: firm Lacs: healing well and intact DVT Evaluation: neg Labs: Lab Results  Component Value Date   WBC 13.5* 11/30/2014   HGB 10.1* 11/30/2014   HCT 30.3* 11/30/2014   MCV 82.7 11/30/2014   PLT 178 11/30/2014   No flowsheet data found.  Discharge instruction: per After Visit Summary.  Medications:   Medication List    ASK your doctor about these medications        ferrous fumarate 325 (106 FE) MG Tabs tablet  Commonly known as:  HEMOCYTE - 106 mg FE  Take 1 tablet by mouth.     prenatal multivitamin Tabs tablet  Take 1 tablet by mouth daily at 12 noon.        Diet:regular  Activity: Advance as tolerated. Pelvic rest for 6 weeks. Nothing in the vagina for 6 weeks, i.e. no tampons, douching, or sex.   Outpatient follow up:6 weeks pp  Postpartum contraception:IUD planned  Newborn Data: Disposition:home with mother  Apgar: APGAR (1 MIN): 7   APGAR (5 MINS): 9   APGAR (10 MINS):     Baby Feeding: Bottle and Breast  12/01/2014 Bethany Velez, CNM

## 2014-12-01 NOTE — Progress Notes (Signed)
Pt states that she received Influenza and TDaP vaccines during pregnancy at [facility].  Pt declines vaccines at this time. Reynold Bowen, RN 12/01/2014 3:23 PM

## 2014-12-11 NOTE — Addendum Note (Signed)
Addendum  created 12/11/14 1031 by Stormy Fabian, CRNA   Modules edited: Anesthesia Responsible Staff

## 2015-01-10 DIAGNOSIS — E669 Obesity, unspecified: Secondary | ICD-10-CM | POA: Insufficient documentation

## 2015-03-24 ENCOUNTER — Encounter: Payer: Self-pay | Admitting: Emergency Medicine

## 2015-03-24 ENCOUNTER — Emergency Department
Admission: EM | Admit: 2015-03-24 | Discharge: 2015-03-24 | Disposition: A | Discharge status: Home / Self Care | Payer: Self-pay | Attending: Emergency Medicine | Admitting: Emergency Medicine

## 2015-03-24 DIAGNOSIS — R000 Tachycardia, unspecified: Secondary | ICD-10-CM | POA: Insufficient documentation

## 2015-03-24 DIAGNOSIS — Z87891 Personal history of nicotine dependence: Secondary | ICD-10-CM | POA: Insufficient documentation

## 2015-03-24 DIAGNOSIS — K2970 Gastritis, unspecified, without bleeding: Secondary | ICD-10-CM | POA: Insufficient documentation

## 2015-03-24 DIAGNOSIS — Z3202 Encounter for pregnancy test, result negative: Secondary | ICD-10-CM | POA: Insufficient documentation

## 2015-03-24 DIAGNOSIS — Z79899 Other long term (current) drug therapy: Secondary | ICD-10-CM | POA: Insufficient documentation

## 2015-03-24 DIAGNOSIS — R112 Nausea with vomiting, unspecified: Secondary | ICD-10-CM

## 2015-03-24 LAB — CBC
HCT: 41.5 % (ref 35.0–47.0)
HEMOGLOBIN: 13.7 g/dL (ref 12.0–16.0)
MCH: 25.8 pg — AB (ref 26.0–34.0)
MCHC: 33 g/dL (ref 32.0–36.0)
MCV: 78.2 fL — AB (ref 80.0–100.0)
Platelets: 255 10*3/uL (ref 150–440)
RBC: 5.31 MIL/uL — AB (ref 3.80–5.20)
RDW: 14.3 % (ref 11.5–14.5)
WBC: 13.1 10*3/uL — ABNORMAL HIGH (ref 3.6–11.0)

## 2015-03-24 LAB — URINALYSIS COMPLETE WITH MICROSCOPIC (ARMC ONLY)
Bacteria, UA: NONE SEEN
Bilirubin Urine: NEGATIVE
Glucose, UA: NEGATIVE mg/dL
Hgb urine dipstick: NEGATIVE
KETONES UR: NEGATIVE mg/dL
Leukocytes, UA: NEGATIVE
Nitrite: NEGATIVE
PH: 6 (ref 5.0–8.0)
PROTEIN: NEGATIVE mg/dL
Specific Gravity, Urine: 1.027 (ref 1.005–1.030)

## 2015-03-24 LAB — COMPREHENSIVE METABOLIC PANEL
ALT: 58 U/L — ABNORMAL HIGH (ref 14–54)
ANION GAP: 10 (ref 5–15)
AST: 35 U/L (ref 15–41)
Albumin: 4.4 g/dL (ref 3.5–5.0)
Alkaline Phosphatase: 78 U/L (ref 38–126)
BUN: 15 mg/dL (ref 6–20)
CHLORIDE: 104 mmol/L (ref 101–111)
CO2: 25 mmol/L (ref 22–32)
Calcium: 9.3 mg/dL (ref 8.9–10.3)
Creatinine, Ser: 0.71 mg/dL (ref 0.44–1.00)
GFR calc non Af Amer: >60 mL/min (ref >60)
Glucose, Bld: 118 mg/dL — ABNORMAL HIGH (ref 65–99)
Potassium: 3.5 mmol/L (ref 3.5–5.1)
SODIUM: 139 mmol/L (ref 135–145)
Total Bilirubin: 0.9 mg/dL (ref 0.3–1.2)
Total Protein: 8.5 g/dL — ABNORMAL HIGH (ref 6.5–8.1)

## 2015-03-24 LAB — POCT PREGNANCY, URINE: Preg Test, Ur: NEGATIVE

## 2015-03-24 LAB — LIPASE, BLOOD
LIPASE: 32 U/L (ref 11–51)
LIPASE: 32 U/L (ref 11–51)

## 2015-03-24 MED ORDER — METOCLOPRAMIDE HCL 5 MG/ML IJ SOLN
10.0000 mg | Freq: Once | INTRAMUSCULAR | Status: AC
Start: 2015-03-24 — End: 2015-03-24
  Administered 2015-03-24: 10 mg via INTRAVENOUS
  Filled 2015-03-24: qty 2

## 2015-03-24 MED ORDER — GI COCKTAIL ~~LOC~~
30.0000 mL | Freq: Once | ORAL | Status: AC
  Administered 2015-03-24: 30 mL via ORAL
  Filled 2015-03-24: qty 30

## 2015-03-24 MED ORDER — ONDANSETRON 4 MG PO TBDP: 4.0000 mg | ORAL_TABLET | Freq: Three times a day (TID) | ORAL | Status: AC | PRN

## 2015-03-24 MED ORDER — ONDANSETRON 4 MG PO TBDP
4.0000 mg | ORAL_TABLET | Freq: Once | ORAL | Status: AC
  Administered 2015-03-24: 4 mg via ORAL

## 2015-03-24 MED ORDER — SUCRALFATE 1 G PO TABS: 1.0000 g | ORAL_TABLET | Freq: Two times a day (BID) | ORAL | Status: DC

## 2015-03-24 MED ORDER — ONDANSETRON 4 MG PO TBDP
ORAL_TABLET | ORAL | Status: AC
  Filled 2015-03-24: qty 1

## 2015-03-24 MED ORDER — SODIUM CHLORIDE 0.9 % IV BOLUS (SEPSIS)
1000.0000 mL | Freq: Once | INTRAVENOUS | Status: AC
  Administered 2015-03-24: 1000 mL via INTRAVENOUS

## 2015-03-24 NOTE — ED Provider Notes (Signed)
[facility] Emergency Department Provider Note  ____________________________________________  Time seen: Approximately 1:37 AM  I have reviewed the triage vital signs and the nursing notes.   HISTORY  Chief Complaint Abdominal Pain and Emesis    HPI Bethany Velez is a 20 y.o. female who comes into the hospital today with vomiting and epigastric pain. The patient reports that she has been vomiting on and off for the past 2 months. She reports that 3 weeks ago she went to the doctor and was told that she needs to see a gastroenterologist but she has not received a call back. The patient reports that she has pain in her upper abdomen but sometimes burning. The patient reports that she has been unable to keep anything down and that was worse today. She reports that she vomited 8 times today and vomits daily 30 minutes to 1 hour after eating. The patient reports that her pain as a 7 out of 10 in intensity and she occasionally has some chest and back pain after vomiting. The patient reports that she was unable to keep anything down so she came in for evaluation.   Past Medical History  Diagnosis Date  . Anemia   . Heart murmur 20 years old    Patient Active Problem List   Diagnosis Date Noted  . Postpartum care following vaginal delivery 11/30/2014  . Labor and delivery, indication for care 11/29/2014  . Labor and delivery indication for care or intervention 11/28/2014  . Indication for care in labor or delivery 10/17/2014  . Pregnancy 08/20/2014    Past Surgical History  Procedure Laterality Date  . No past surgeries      Current Outpatient Rx  Name  Route  Sig  Dispense  Refill  . ferrous fumarate (HEMOCYTE - 106 MG FE) 325 (106 FE) MG TABS tablet   Oral   Take 1 tablet by mouth.         . ondansetron (ZOFRAN ODT) 4 MG disintegrating tablet   Oral   Take 1 tablet (4 mg total) by mouth every 8 (eight) hours as needed for nausea or vomiting.   20  tablet   0   . Prenatal Vit-Fe Fumarate-FA (PRENATAL MULTIVITAMIN) TABS tablet   Oral   Take 1 tablet by mouth daily at 12 noon.         . sucralfate (CARAFATE) 1 g tablet   Oral   Take 1 tablet (1 g total) by mouth 2 (two) times daily.   20 tablet   0     Allergies Review of patient's allergies indicates no known allergies.  History reviewed. No pertinent family history.  Social History Social History  Substance Use Topics  . Smoking status: Former Games developermoker  . Smokeless tobacco: Never Used  . Alcohol Use: No    Review of Systems Constitutional: No fever/chills Eyes: No visual changes. ENT: No sore throat. Cardiovascular: Denies chest pain. Respiratory: Denies shortness of breath. Gastrointestinal:  abdominal pain.  Nausea and vomiting.  No diarrhea.  No constipation. Genitourinary: Negative for dysuria. Musculoskeletal: Negative for back pain. Skin: Negative for rash. Neurological: Negative for headaches, focal weakness or numbness.  10-point ROS otherwise negative.  ____________________________________________   PHYSICAL EXAM:  VITAL SIGNS: ED Triage Vitals  Enc Vitals Group     BP 03/24/15 0040 133/81 mmHg     Pulse Rate 03/24/15 0040 96     Resp 03/24/15 0040 16     Temp 03/24/15 0043 98.6 F (37  C)     Temp Source 03/24/15 0040 Oral     SpO2 03/24/15 0040 100 %     Weight 03/24/15 0040 225 lb (102.059 kg)     Height 03/24/15 0040 5\' 2"  (1.575 m)     Head Cir --      Peak Flow --      Pain Score 03/24/15 0040 6     Pain Loc --      Pain Edu? --      Excl. in GC? --     Constitutional: Alert and oriented. Well appearing and in mild distress. Eyes: Conjunctivae are normal. PERRL. EOMI. Head: Atraumatic. Nose: No congestion/rhinnorhea. Mouth/Throat: Mucous membranes are moist.  Oropharynx non-erythematous. Cardiovascular: Tachycardia, regular rhythm. Grossly normal heart sounds.  Good peripheral circulation. Respiratory: Normal respiratory  effort.  No retractions. Lungs CTAB. Gastrointestinal: Soft with epigastric abd pain. No distention.positive bowel sounds Musculoskeletal: No lower extremity tenderness nor edema.  Neurologic:  Normal speech and language.  Skin:  Skin is warm, dry and intact.  Psychiatric: Mood and affect are normal.   ____________________________________________   LABS (all labs ordered are listed, but only abnormal results are displayed)  Labs Reviewed  COMPREHENSIVE METABOLIC PANEL - Abnormal; Notable for the following:    Glucose, Bld 118 (*)    Total Protein 8.5 (*)    ALT 58 (*)    All other components within normal limits  CBC - Abnormal; Notable for the following:    WBC 13.1 (*)    RBC 5.31 (*)    MCV 78.2 (*)    MCH 25.8 (*)    All other components within normal limits  URINALYSIS COMPLETEWITH MICROSCOPIC ([facility] ONLY) - Abnormal; Notable for the following:    Color, Urine YELLOW (*)    APPearance CLEAR (*)    Squamous Epithelial / LPF 0-5 (*)    All other components within normal limits  LIPASE, BLOOD  LIPASE, BLOOD  POC URINE PREG, ED  POCT PREGNANCY, URINE   ____________________________________________  EKG  none ____________________________________________  RADIOLOGY  none ____________________________________________   PROCEDURES  Procedure(s) performed: None  Critical Care performed: No  ____________________________________________   INITIAL IMPRESSION / ASSESSMENT AND PLAN / ED COURSE  Pertinent labs & imaging results that were available during my care of the patient were reviewed by me and considered in my medical decision making (see chart for details).  This is a 20 year old female who comes in with epigastric pain and vomiting for multiple months. The patient sounds as though she may have gastritis and a possible ulcer. I will give the patient a liter of normal saline and she is tachycardic and some Zofran. I will then attempt to give the patient some  medication such as GI cocktail to help take away her pain and another medication for her gastritis symptoms. I will reassess the patient when she's received her medication.  The patient was able to take some po without vomiting. She will be discharged to follow up with GI or with her primary care physician. ____________________________________________   FINAL CLINICAL IMPRESSION(S) / ED DIAGNOSES  Final diagnoses:  Gastritis  Non-intractable vomiting with nausea, vomiting of unspecified type      Rebecka Apley, MD 03/24/15 [phone]

## 2015-03-24 NOTE — ED Notes (Signed)
Pt up to restroom to void. Pt provided with water for po challenge.

## 2015-03-24 NOTE — ED Notes (Signed)
MD Webster at bedside 

## 2015-03-24 NOTE — ED Notes (Signed)
Pt states for 2 months has had emesis and mid abdominal pain after eating. Pt states has been vomiting since yesterday am with associated diarrhea. Pt states last ate food ([employer]) at 1600 yesterday. Pt denies shob, dizziness, diaphoresis.

## 2015-03-24 NOTE — ED Notes (Signed)
Lab called regarding lipase add on. Already resulted. MD Zenda AlpersWebster made aware

## 2015-03-24 NOTE — ED Notes (Signed)
Pt has tolerated po challenge 

## 2015-03-24 NOTE — Discharge Instructions (Signed)
Gastritis, Adult °Gastritis is soreness and swelling (inflammation) of the lining of the stomach. Gastritis can develop as a sudden onset (acute) or long-term (chronic) condition. If gastritis is not treated, it can lead to stomach bleeding and ulcers. °CAUSES  °Gastritis occurs when the stomach lining is weak or damaged. Digestive juices from the stomach then inflame the weakened stomach lining. The stomach lining may be weak or damaged due to viral or bacterial infections. One common bacterial infection is the Helicobacter pylori infection. Gastritis can also result from excessive alcohol consumption, taking certain medicines, or having too much acid in the stomach.  °SYMPTOMS  °In some cases, there are no symptoms. When symptoms are present, they may include: °· Pain or a burning sensation in the upper abdomen. °· Nausea. °· Vomiting. °· An uncomfortable feeling of fullness after eating. °DIAGNOSIS  °Your caregiver may suspect you have gastritis based on your symptoms and a physical exam. To determine the cause of your gastritis, your caregiver may perform the following: °· Blood or stool tests to check for the H pylori bacterium. °· Gastroscopy. A thin, flexible tube (endoscope) is passed down the esophagus and into the stomach. The endoscope has a light and camera on the end. Your caregiver uses the endoscope to view the inside of the stomach. °· Taking a tissue sample (biopsy) from the stomach to examine under a microscope. °TREATMENT  °Depending on the cause of your gastritis, medicines may be prescribed. If you have a bacterial infection, such as an H pylori infection, antibiotics may be given. If your gastritis is caused by too much acid in the stomach, H2 blockers or antacids may be given. Your caregiver may recommend that you stop taking aspirin, ibuprofen, or other nonsteroidal anti-inflammatory drugs (NSAIDs). °HOME CARE INSTRUCTIONS °· Only take over-the-counter or prescription medicines as directed by  your caregiver. °· If you were given antibiotic medicines, take them as directed. Finish them even if you start to feel better. °· Drink enough fluids to keep your urine clear or pale yellow. °· Avoid foods and drinks that make your symptoms worse, such as: °¨ Caffeine or alcoholic drinks. °¨ Chocolate. °¨ Peppermint or mint flavorings. °¨ Garlic and onions. °¨ Spicy foods. °¨ Citrus fruits, such as oranges, lemons, or limes. °¨ Tomato-based foods such as sauce, chili, salsa, and pizza. °¨ Fried and fatty foods. °· Eat small, frequent meals instead of large meals. °SEEK IMMEDIATE MEDICAL CARE IF:  °· You have black or dark red stools. °· You vomit blood or material that looks like coffee grounds. °· You are unable to keep fluids down. °· Your abdominal pain gets worse. °· You have a fever. °· You do not feel better after 1 week. °· You have any other questions or concerns. °MAKE SURE YOU: °· Understand these instructions. °· Will watch your condition. °· Will get help right away if you are not doing well or get worse. °  °This information is not intended to replace advice given to you by your health care provider. Make sure you discuss any questions you have with your health care provider. °  °Document Released: 02/24/2001 Document Revised: 09/01/2011 Document Reviewed: 04/15/2011 °Elsevier Interactive Patient Education ©2016 [employer] ° °Nausea and Vomiting °Nausea is a sick feeling that often comes before throwing up (vomiting). Vomiting is a reflex where stomach contents come out of your mouth. Vomiting can cause severe loss of body fluids (dehydration). Children and elderly adults can become dehydrated quickly, especially if they also have   diarrhea. Nausea and vomiting are symptoms of a condition or disease. It is important to find the cause of your symptoms. °CAUSES  °· Direct irritation of the stomach lining. This irritation can result from increased acid production (gastroesophageal reflux disease),  infection, food poisoning, taking certain medicines (such as nonsteroidal anti-inflammatory drugs), alcohol use, or tobacco use. °· Signals from the brain. These signals could be caused by a headache, heat exposure, an inner ear disturbance, increased pressure in the brain from injury, infection, a tumor, or a concussion, pain, emotional stimulus, or metabolic problems. °· An obstruction in the gastrointestinal tract (bowel obstruction). °· Illnesses such as diabetes, hepatitis, gallbladder problems, appendicitis, kidney problems, cancer, sepsis, atypical symptoms of a heart attack, or eating disorders. °· Medical treatments such as chemotherapy and radiation. °· Receiving medicine that makes you sleep (general anesthetic) during surgery. °DIAGNOSIS °Your caregiver may ask for tests to be done if the problems do not improve after a few days. Tests may also be done if symptoms are severe or if the reason for the nausea and vomiting is not clear. Tests may include: °· Urine tests. °· Blood tests. °· Stool tests. °· Cultures (to look for evidence of infection). °· X-rays or other imaging studies. °Test results can help your caregiver make decisions about treatment or the need for additional tests. °TREATMENT °You need to stay well hydrated. Drink frequently but in small amounts. You may wish to drink water, sports drinks, clear broth, or eat frozen ice pops or gelatin dessert to help stay hydrated. When you eat, eating slowly may help prevent nausea. There are also some antinausea medicines that may help prevent nausea. °HOME CARE INSTRUCTIONS  °· Take all medicine as directed by your caregiver. °· If you do not have an appetite, do not force yourself to eat. However, you must continue to drink fluids. °· If you have an appetite, eat a normal diet unless your caregiver tells you differently. °¨ Eat a variety of complex carbohydrates (rice, wheat, potatoes, bread), lean meats, yogurt, fruits, and vegetables. °¨ Avoid  high-fat foods because they are more difficult to digest. °· Drink enough water and fluids to keep your urine clear or pale yellow. °· If you are dehydrated, ask your caregiver for specific rehydration instructions. Signs of dehydration may include: °¨ Severe thirst. °¨ Dry lips and mouth. °¨ Dizziness. °¨ Dark urine. °¨ Decreasing urine frequency and amount. °¨ Confusion. °¨ Rapid breathing or pulse. °SEEK IMMEDIATE MEDICAL CARE IF:  °· You have blood or brown flecks (like coffee grounds) in your vomit. °· You have black or bloody stools. °· You have a severe headache or stiff neck. °· You are confused. °· You have severe abdominal pain. °· You have chest pain or trouble breathing. °· You do not urinate at least once every 8 hours. °· You develop cold or clammy skin. °· You continue to vomit for longer than 24 to 48 hours. °· You have a fever. °MAKE SURE YOU:  °· Understand these instructions. °· Will watch your condition. °· Will get help right away if you are not doing well or get worse. °  °This information is not intended to replace advice given to you by your health care provider. Make sure you discuss any questions you have with your health care provider. °  °Document Released: 03/02/2005 Document Revised: 05/25/2011 Document Reviewed: 07/30/2010 °Elsevier Interactive Patient Education ©2016 [employer] ° °

## 2015-04-17 ENCOUNTER — Other Ambulatory Visit: Payer: Self-pay | Admitting: Nurse Practitioner

## 2015-04-17 DIAGNOSIS — K219 Gastro-esophageal reflux disease without esophagitis: Secondary | ICD-10-CM

## 2015-04-17 DIAGNOSIS — R1010 Upper abdominal pain, unspecified: Secondary | ICD-10-CM

## 2015-04-17 DIAGNOSIS — R112 Nausea with vomiting, unspecified: Secondary | ICD-10-CM

## 2015-04-30 ENCOUNTER — Ambulatory Visit
Admission: RE | Admit: 2015-04-30 | Discharge: 2015-04-30 | Disposition: A | Discharge status: Home / Self Care | Payer: Self-pay | Source: Ambulatory Visit | Attending: Nurse Practitioner | Admitting: Nurse Practitioner

## 2015-04-30 DIAGNOSIS — R1010 Upper abdominal pain, unspecified: Secondary | ICD-10-CM

## 2015-04-30 DIAGNOSIS — K760 Fatty (change of) liver, not elsewhere classified: Secondary | ICD-10-CM | POA: Insufficient documentation

## 2015-04-30 DIAGNOSIS — R112 Nausea with vomiting, unspecified: Secondary | ICD-10-CM

## 2015-04-30 DIAGNOSIS — K8020 Calculus of gallbladder without cholecystitis without obstruction: Secondary | ICD-10-CM | POA: Insufficient documentation

## 2015-04-30 DIAGNOSIS — K219 Gastro-esophageal reflux disease without esophagitis: Secondary | ICD-10-CM | POA: Insufficient documentation

## 2015-07-08 DIAGNOSIS — L089 Local infection of the skin and subcutaneous tissue, unspecified: Secondary | ICD-10-CM | POA: Insufficient documentation

## 2016-04-21 LAB — HM PAP SMEAR: HM Pap smear: NEGATIVE

## 2016-06-11 ENCOUNTER — Other Ambulatory Visit: Payer: Self-pay | Admitting: Family Medicine

## 2016-06-11 ENCOUNTER — Ambulatory Visit
Admission: RE | Admit: 2016-06-11 | Discharge: 2016-06-11 | Disposition: A | Discharge status: Home / Self Care | Payer: Medicaid Other | Source: Ambulatory Visit | Attending: Family Medicine | Admitting: Family Medicine

## 2016-06-11 DIAGNOSIS — Z975 Presence of (intrauterine) contraceptive device: Secondary | ICD-10-CM | POA: Insufficient documentation

## 2016-06-11 DIAGNOSIS — N941 Unspecified dyspareunia: Secondary | ICD-10-CM | POA: Diagnosis not present

## 2016-06-11 DIAGNOSIS — T385X5A Adverse effect of other estrogens and progestogens, initial encounter: Secondary | ICD-10-CM

## 2016-06-11 DIAGNOSIS — N939 Abnormal uterine and vaginal bleeding, unspecified: Secondary | ICD-10-CM | POA: Insufficient documentation

## 2016-06-11 DIAGNOSIS — N921 Excessive and frequent menstruation with irregular cycle: Secondary | ICD-10-CM

## 2016-09-30 ENCOUNTER — Ambulatory Visit
Admission: RE | Admit: 2016-09-30 | Discharge: 2016-09-30 | Disposition: A | Discharge status: Home / Self Care | Payer: MEDICAID

## 2016-09-30 DIAGNOSIS — L732 Hidradenitis suppurativa: Principal | ICD-10-CM

## 2016-09-30 MED ORDER — ADALIMUMAB 40 MG/0.8 ML SUBCUTANEOUS PEN KIT
SUBCUTANEOUS | 0 refills | 0.00000 days | Status: CP
Start: 2016-09-30 — End: ?

## 2016-09-30 MED ORDER — ADALIMUMAB 40 MG/0.8 ML SUBCUTANEOUS PEN KIT: each | 0 refills | 0 days | Status: AC

## 2016-09-30 MED ORDER — CLINDAMYCIN HCL 300 MG CAPSULE
ORAL_CAPSULE | 2 refills | 0 days | Status: CP
Start: 2016-09-30 — End: ?

## 2016-09-30 MED ORDER — RIFAMPIN 300 MG CAPSULE
ORAL_CAPSULE | 2 refills | 0 days | Status: CP
Start: 2016-09-30 — End: ?

## 2016-09-30 NOTE — Unmapped (Signed)
ASSESSMENT/PLAN:    1. Hidradenitis Suppurativa     Hurley 2 with many active inflammatory nodules as well as small sinsues in axilla .   We discussed the typical natural history, pathogenesis, treatment options, and expected course as well as the relapsing and sometimes recalcitrant nature of the disease.  Discussed treatment today at length.   Given many active inflammatory nodules biologic would likely be helpful, and after discussion today she would like to go ahead with this.         Start today:   Clindamycin 300mg  po bid and rifampin 300mg  po bid.  Discussed risk of GI upset, orange coloration of body fluids, hepatotoxicity, and myelosuppression.    Will start adalimumab as below:   Start Humira 160mg  day 1, then 80mg  day 15, then 40mg  [place] qweek starting day 29 for treatment of hidradenitis.  Discussed risks of tuberculosis, other uncommon infections, theoretical risk of malignancies, and other uncommon side effects.  [facility].  We will check baseline screening labs today.        RTC: 2 months      SUBJECTIVE:    CC: Hidradenitis Suppurativa    Patient returns for follow-up on hidradenitis suppurativa. At last visit was started on doxycycline which she feels like has been somewhat helpful, however she continues to have flares with many inflammatory nodules currently with several on the right axilla, abdomen that have been painful and active.  She's been tolerating the doxycycline well.      Note: She is comfortable in [place] however Jeani Hawking helped translate certain specialized vocabulary today    Disease course:  Age when symptoms first noticed: puberty   Date of diagnosis: this year  Location of first symptoms: abdomen  Typical involved areas include: abdomen, intermammary, axilla,   Typical number of inflammatory lesions each month at baseline: many  Disease triggers: heat/friction   Family History: not know     Relationship to menses: not noted (no cycle with IUD currently)   Current form of contraception: IUD  Response to previous hormonal therapy or contraception: none    Prior treatments:  Topical: various topical ABX  Systemic: short course course of abx (sometime very helpful)   Procedures: some I&Ds  ED visits?  yes    ROS: the balance of 10 systems is negative unless otherwise documented      OBJECTIVE:   Gen: Well-appearing patient, appropriate, interactive, in no acute distress  Skin: Examination of the scalp, face, neck, chest, back, abdomen, bilateral upper and lower extremities, hands, palms, soles, nails, buttocks, and external genitalia performed today and pertinent for:     Inflammatory nodules scattered on abdomen, bilateral thighs, suprapubic, intermammary, axilla. (>20 inflammatory nodules)    1 probable small sinus and right axilla and right intermammary.      AN count (total sum of abscess and inflammatory nodule):  Pilonidal sinus? no

## 2016-09-30 NOTE — Unmapped (Signed)

## 2016-10-01 LAB — HEPATITIS C ANTIBODY: Hepatitis C virus Ab:PrThr:Pt:Ser:Ord:: NONREACTIVE

## 2016-10-01 LAB — HEPATITIS B SURFACE ANTIBODY QUANT: Hepatitis B virus surface Ab:ACnc:Pt:Ser:Qn:: 13.62 — ABNORMAL HIGH

## 2016-10-01 LAB — HEPATITIS B SURFACE ANTIGEN
HEPATITIS B SURFACE ANTIGEN: NONREACTIVE
Hepatitis B virus surface Ag:PrThr:Pt:Ser:Ord:: NONREACTIVE

## 2016-10-01 LAB — HEPATITIS B CORE TOTAL ANTIBODY: Hepatitis B virus core Ab:PrThr:Pt:Ser/Plas:Ord:IA: NONREACTIVE

## 2016-10-01 LAB — HIV ANTIGEN/ANTIBODY COMBO: HIV 1+2 Ab+HIV1 p24 Ag:PrThr:Pt:Ser/Plas:Ord:IA: NONREACTIVE

## 2016-10-01 LAB — HEPATITIS B SURFACE ANTIBODY: HEPATITIS B SURFACE ANTIBODY: REACTIVE — AB

## 2016-10-01 NOTE — Unmapped (Unsigned)
HUMIRA APPROVED FOR $3

## 2016-10-02 LAB — QUANTIFERON TB GOLD: QUANTIFERON ANTIGEN 1 MINUS NIL: -0.01 [IU]/mL

## 2016-10-02 LAB — QUANTIFERON ANTIGEN 1 MINUS NIL: Lab: -0.01

## 2016-10-02 NOTE — Unmapped (Signed)
[facility]   Patient Onboarding/Medication Counseling    Goodroe is a 21 y.o. female with Hidradenitis who I am counseling today on initiation of therapy.    Medication: Humira Pen    Verified patient's date of birth / HIPAA.      Education Provided: ??    Dose/Administration discussed: 4 pens day 1, 2 pens day 15, then 1 injection every 7 days beginning day 29. This medication should be taken  without regard to food.     Storage requirements: this medicine should be stored in the refrigerator.     Side effects discussed: Discussed common side effects, including injection site reaction, potential headache, or potential for increased infection risk. If patient experiences fever/chills, they need to call the doctor.  Patient will receive a Lexi-Comp drug information handout with shipment.    Handling precautions reviewed:  Patient will dispose of needles in a sharps container or empty laundry detergent bottle.    Drug Interactions: other medications reviewed and up to date in Epic.  No drug interactions identified.    Comorbidities/Allergies: reviewed and up to date in Epic.    Verified therapy is appropriate and should continue      Delivery Information    Anticipated copay of $3 reviewed with patient. Verified delivery address in FSI and reviewed medication storage requirement.    Scheduled delivery date: Tues, July 24    Explained that we ship using UPS and this shipment will not require a signature.      Explained the services we provide at [facility] and that each month we would call to set up refills.  Stressed importance of returning phone calls so that we could ensure they receive their medications in time each month.  Informed patient that we should be setting up refills 7-10 days prior to when they will run out of medication.  Informed patient that welcome packet will be sent.      Patient verbalized understanding of the above information as well as how to contact the pharmacy at [phone] option 4 with any questions/concerns.        Patient Specific Needs      ? Patient has no physical or cognitive barriers.    ? Patient prefers to have medications discussed with  Patient     ? Patient is able to read and understand education materials at a high school level or above.        Lanney Gins  [facility] Specialty Pharmacist

## 2016-10-05 MED FILL — HUMIRA PF STARTER PEN/40MG/0.8mL/KIT: HUMIRA PF STARTER PEN/40MG/0.8mL/KIT | 28 days supply | Qty: 1 | Fill #0

## 2016-10-14 NOTE — Unmapped (Signed)
All labs good for Humira start.  Hepatitis B immune.      Lynden Ang,     Could you call this patient to let them know. It looks like the pharmacy has approved the Humira and should be mailing it to her soon.

## 2016-11-11 MED ORDER — EMPTY CONTAINER
2 refills | 0 days
Start: 2016-11-11 — End: 2017-11-11

## 2016-11-11 MED FILL — SHARPS KIT/NA/MISC: SHARPS KIT/NA/MISC | 120 days supply | Qty: 1 | Fill #0

## 2016-11-11 MED FILL — HUMIRA PF PEN (BOX)/40MG/0.8mL/PEN: HUMIRA PF PEN (BOX)/40MG/0.8mL/PEN | 28 days supply | Qty: 2 | Fill #0

## 2016-11-11 NOTE — Unmapped (Signed)
[facility] Refill and Clinical Coordination Note  Medication(s): Humira, sharps container    Shawna Beck, DOB: 1995/08/26  Phone: [phone] (home) , Alternate phone contact: N/A  Shipping address: [address]  [place] [address]  Phone or address changes today?: No  All above HIPAA information verified.  Insurance changes? No    Completed refill and clinical call assessment today to schedule patient's medication shipment from the [facility] [phone]).      MEDICATION RECONCILIATION    Confirmed the medication and dosage are correct and have not changed: Yes, regimen is correct and unchanged.    Were there any changes to your medication(s) in the past month:  No, there are no changes reported at this time.    ADHERENCE    Is this medicine transplant or covered by Medicare Part B? No.    Did you miss any doses in the past 4 weeks? Yes.  Shawna Beck reports missing 1 days of medication therapy in the last 4 weeks.  Shawna Beck reports pharmacy didn't call patient (my fault) as the cause of their non-adherance.  Adherence counseling provided? Not needed     SIDE EFFECT MANAGEMENT    Are you tolerating your medication?:  Shawna Beck reports tolerating the medication.  Side effect management discussed: None      Therapy is appropriate and should be continued.    Evidence of clinical benefit: See Epic note from na/ not seen since starting medication.      FINANCIAL/SHIPPING    Delivery Scheduled: Yes, Expected medication delivery date: Thurs, Aug 30   Additional medications refilled: No additional medications/refills needed at this time.    Shawna Beck did not have any additional questions at this time.    Delivery address validated in FSI scheduling system: Yes, address listed above is correct.      We will follow up with patient monthly for standard refill processing and delivery.      Thank you,  Tawanna Solo [facility] Specialty Pharmacist

## 2016-12-01 NOTE — Unmapped (Signed)
[facility] Refill Coordination Note  Specialty Medication(s): HUMIRA   Additional Medications shipped: Shawna Beck [facility], DOB: 1995/12/22  Phone: [phone] (home) , Alternate phone contact: N/A  Phone or address changes today?: No  All above HIPAA information was verified with patient.  Shipping Address: [address]  [place] [address]   Insurance changes? No    Completed refill call assessment today to schedule patient's medication shipment from the [facility] [phone]).      Confirmed the medication and dosage are correct and have not changed: Yes, regimen is correct and unchanged.    Confirmed patient started or stopped the following medications in the past month:  No, there are no changes reported at this time.    Are you tolerating your medication?:  Shawna Beck reports tolerating the medication.    ADHERENCE    (Below is required for Medicare Part B or Transplant patients only - per drug):   How many tablets were dispensed last month: 4  Patient currently has 0 remaining.    Did you miss any doses in the past 4 weeks? No missed doses reported.    FINANCIAL/SHIPPING    Delivery Scheduled: Yes, Expected medication delivery date: 12/09/2016     Shawna Beck did not have any additional questions at this time.    Delivery address validated in FSI scheduling system: Yes, address listed in FSI is correct.    We will follow up with patient monthly for standard refill processing and delivery.      Thank you,  Dorothea Ogle   [facility] Specialty Technician

## 2016-12-07 MED FILL — HUMIRA PF PEN (BOX)/40MG/0.8mL/PEN: HUMIRA PF PEN (BOX)/40MG/0.8mL/PEN | 28 days supply | Qty: 2 | Fill #1

## 2016-12-28 ENCOUNTER — Ambulatory Visit: Admission: RE | Admit: 2016-12-28 | Discharge: 2016-12-28 | Payer: MEDICAID

## 2016-12-28 DIAGNOSIS — L732 Hidradenitis suppurativa: Secondary | ICD-10-CM

## 2016-12-28 DIAGNOSIS — Z8619 Personal history of other infectious and parasitic diseases: Principal | ICD-10-CM

## 2016-12-28 MED ORDER — VALACYCLOVIR 1 GRAM TABLET
ORAL_TABLET | Freq: Every day | ORAL | 5 refills | 0.00000 days | Status: CP
Start: 2016-12-28 — End: 2018-07-28

## 2016-12-28 MED ORDER — SPIRONOLACTONE 50 MG TABLET
ORAL_TABLET | Freq: Every day | ORAL | 3 refills | 0 days | Status: CP
Start: 2016-12-28 — End: 2017-12-28

## 2016-12-28 NOTE — Unmapped (Signed)
ASSESSMENT/PLAN:    1. Hidradenitis Suppurativa     Hurley 2, with one non-draining sinus; mostly [place] 1 with follicular phenotype and extensive scattered inflamatory nodules.  We discussed the typical natural history, pathogenesis, treatment options, and expected course as well as the relapsing and sometimes recalcitrant nature of the disease.  Discussed treatment today at length.      --Has been improving significantly on Humira and clinda/rif.  Tolerating the clinda/rif well with no noted side effects and we will plan to continue for 1 more month.     -    Will start today spironolactone (ALDACTONE) 50 MG tablet; Take 1 tablet (50 mg total) by mouth daily.  Discussed common side effects.      H/O cold sores  -     valACYclovir (VALTREX) 1000 MG tablet; Take 1 tablet (1,000 mg total) by mouth daily. Once daily for 5 days w flares        RTC: 2 months      SUBJECTIVE:    CC: Hidradenitis Suppurativa    Patient returns for follow-up on hidradenitis suppurativa. At last visit started with clinda/riff and Humira.  Has finished loading dose and note a big inprovement since starting the antibiotic combo; tolerating well with no noted s/e.  Notes few new area and that current flared nodules are resolving much quicker than before.  Request rx for valtrex (history of cold sores)     Note: She is comfortable in English however Jeani Hawking helped translate certain specialized vocabulary today    Disease course:  Age when symptoms first noticed: puberty   Date of diagnosis: this year  Location of first symptoms: abdomen  Typical involved areas include: abdomen, intermammary, axilla,   Typical number of inflammatory lesions each month at baseline: many  Disease triggers: heat/friction   Family History: not know     Relationship to menses: not noted (no cycle with IUD currently)   Current form of contraception: IUD  Response to previous hormonal therapy or contraception: none    Prior treatments:  Topical: various topical ABX  Systemic: short course course of abx (sometime very helpful)   Procedures: some I&Ds  ED visits?  yes    ROS: the balance of 10 systems is negative unless otherwise documented      OBJECTIVE:   Gen: Well-appearing patient, appropriate, interactive, in no acute distress  Skin: Examination of the scalp, face, neck, chest, back, abdomen, bilateral upper and lower extremities, hands, palms, soles, nails, buttocks, and external genitalia performed today and pertinent for:     Inflammatory nodules scattered on abdomen, bilateral thighs, suprapubic, intermammary, axilla. (about15 inflammatory nodules); large improvement since last visit.    1 probable small sinus and right axilla and right intermammary.      AN count (total sum of abscess and inflammatory nodule):  Pilonidal sinus? no

## 2016-12-28 NOTE — Unmapped (Signed)
[facility] Refill Coordination Note  Specialty Medication(s): HUMIRA  Additional Medications shipped: TERUKO JOSWICK [facility], DOB: March 13, 1996  Phone: [phone] (home) , Alternate phone contact: N/A  Phone or address changes today?: No  All above HIPAA information was verified with patient.  Shipping Address: [address]  [place] [address]   Insurance changes? No    Completed refill call assessment today to schedule patient's medication shipment from the [facility] [phone]).      Confirmed the medication and dosage are correct and have not changed: Yes, regimen is correct and unchanged.    Confirmed patient started or stopped the following medications in the past month:  No, there are no changes reported at this time.    Are you tolerating your medication?:  Jaqulyn reports tolerating the medication.    ADHERENCE    (Below is required for Medicare Part B or Transplant patients only - per drug):   How many tablets were dispensed last month: 4  Patient currently has 1 remaining.    Did you miss any doses in the past 4 weeks? No missed doses reported.    FINANCIAL/SHIPPING    Delivery Scheduled: Yes, Expected medication delivery date: 01/05/2017     Zondra did not have any additional questions at this time.    Delivery address validated in FSI scheduling system: Yes, address listed in FSI is correct.    We will follow up with patient monthly for standard refill processing and delivery.      Thank you,  Dorothea Ogle   [facility] Specialty Technician

## 2016-12-28 NOTE — Unmapped (Signed)

## 2017-01-03 MED FILL — HUMIRA PF PEN (BOX)/40MG/0.8mL/PEN: HUMIRA PF PEN (BOX)/40MG/0.8mL/PEN | 28 days supply | Qty: 2 | Fill #2

## 2017-03-31 ENCOUNTER — Other Ambulatory Visit: Payer: Self-pay

## 2017-03-31 ENCOUNTER — Encounter: Payer: Self-pay | Admitting: *Deleted

## 2017-03-31 DIAGNOSIS — G935 Compression of brain: Secondary | ICD-10-CM | POA: Insufficient documentation

## 2017-03-31 DIAGNOSIS — Z79899 Other long term (current) drug therapy: Secondary | ICD-10-CM | POA: Insufficient documentation

## 2017-03-31 DIAGNOSIS — Z87891 Personal history of nicotine dependence: Secondary | ICD-10-CM | POA: Insufficient documentation

## 2017-03-31 NOTE — ED Triage Notes (Signed)
Pt reports a headache and vomiting since 1700 today. Pt alert.  Speech clear.  Pt took tylenol without pain relief.

## 2017-04-01 ENCOUNTER — Emergency Department
Admission: EM | Admit: 2017-04-01 | Discharge: 2017-04-01 | Disposition: A | Discharge status: Home / Self Care | Payer: Self-pay | Attending: Emergency Medicine | Admitting: Emergency Medicine

## 2017-04-01 ENCOUNTER — Emergency Department: Payer: Self-pay

## 2017-04-01 DIAGNOSIS — G935 Compression of brain: Secondary | ICD-10-CM

## 2017-04-01 DIAGNOSIS — R519 Headache, unspecified: Secondary | ICD-10-CM

## 2017-04-01 DIAGNOSIS — R51 Headache: Secondary | ICD-10-CM

## 2017-04-01 MED ORDER — DIPHENHYDRAMINE HCL 50 MG/ML IJ SOLN
50.0000 mg | Freq: Once | INTRAMUSCULAR | Status: AC
  Administered 2017-04-01: 50 mg via INTRAVENOUS
  Filled 2017-04-01: qty 1

## 2017-04-01 MED ORDER — SUMATRIPTAN SUCCINATE 50 MG PO TABS: 50.0000 mg | ORAL_TABLET | Freq: Once | ORAL | 0 refills | Status: DC | PRN

## 2017-04-01 MED ORDER — PROCHLORPERAZINE EDISYLATE 5 MG/ML IJ SOLN
10.0000 mg | Freq: Once | INTRAMUSCULAR | Status: AC
Start: 2017-04-01 — End: 2017-04-01
  Administered 2017-04-01: 10 mg via INTRAVENOUS
  Filled 2017-04-01: qty 2

## 2017-04-01 MED ORDER — KETOROLAC TROMETHAMINE 30 MG/ML IJ SOLN
15.0000 mg | Freq: Once | INTRAMUSCULAR | Status: AC
  Administered 2017-04-01: 15 mg via INTRAVENOUS
  Filled 2017-04-01: qty 1

## 2017-04-01 NOTE — ED Provider Notes (Signed)
[facility] Emergency Department Provider Note  ____________________________________________   First MD Initiated Contact with Patient 04/01/17 0114     (approximate)  I have reviewed the triage vital signs and the nursing notes.   HISTORY  Chief Complaint Headache   HPI Bethany Velez is a 22 y.o. female who self presents to the emergency department with gradual onset not maximal onset left throbbing moderate to severe headache that began around 5 PM today several hours prior to arrival.  This headache is different from previous headaches she has had.  The headache seems to be improved somewhat when sitting up and somewhat worsened when lying flat.  It is associated with nausea and photophobia.  No neck pain.  No fevers or chills.  No coryza.  No numbness or weakness.  No double vision or blurred vision.  No trauma.    Past Medical History:  Diagnosis Date  . Anemia   . Heart murmur 22 years old    Patient Active Problem List   Diagnosis Date Noted  . Postpartum care following vaginal delivery 11/30/2014  . Labor and delivery, indication for care 11/29/2014  . Labor and delivery indication for care or intervention 11/28/2014  . Indication for care in labor or delivery 10/17/2014  . Pregnancy 08/20/2014    Past Surgical History:  Procedure Laterality Date  . NO PAST SURGERIES      Prior to Admission medications   Medication Sig Start Date End Date Taking? Authorizing Provider  ferrous fumarate (HEMOCYTE - 106 MG FE) 325 (106 FE) MG TABS tablet Take 1 tablet by mouth.    [provider]  ondansetron (ZOFRAN ODT) 4 MG disintegrating tablet Take 1 tablet (4 mg total) by mouth every 8 (eight) hours as needed for nausea or vomiting. 03/24/15   Rebecka Apley, MD  Prenatal Vit-Fe Fumarate-FA (PRENATAL MULTIVITAMIN) TABS tablet Take 1 tablet by mouth daily at 12 noon.    [provider]  sucralfate (CARAFATE) 1 g tablet Take 1  tablet (1 g total) by mouth 2 (two) times daily. 03/24/15   Rebecka Apley, MD  SUMAtriptan (IMITREX) 50 MG tablet Take 1 tablet (50 mg total) by mouth once as needed for migraine. May repeat in 2 hours if headache persists or recurs. 04/01/17 04/02/18  Merrily Brittle, MD    Allergies Patient has no known allergies.  No family history on file.  Social History Social History   Tobacco Use  . Smoking status: Former Games developer  . Smokeless tobacco: Never Used  Substance Use Topics  . Alcohol use: No  . Drug use: No    Review of Systems Constitutional: No fever/chills Eyes: No visual changes. ENT: No sore throat. Cardiovascular: Denies chest pain. Respiratory: Denies shortness of breath. Gastrointestinal: No abdominal pain.  Positive for nausea, no vomiting.  No diarrhea.  No constipation. Genitourinary: Negative for dysuria. Musculoskeletal: Negative for back pain. Skin: Negative for rash. Neurological: Positive for headache.   ____________________________________________   PHYSICAL EXAM:  VITAL SIGNS: ED Triage Vitals  Enc Vitals Group     BP 03/31/17 2234 128/77     Pulse Rate 03/31/17 2234 62     Resp 03/31/17 2234 20     Temp 03/31/17 2234 (!) 97.5 F (36.4 C)     Temp Source 03/31/17 2234 Oral     SpO2 03/31/17 2234 99 %     Weight 03/31/17 2235 230 lb (104.3 kg)     Height 03/31/17 2235 5\' 2"  (  1.575 m)     Head Circumference --      Peak Flow --      Pain Score 03/31/17 2234 9     Pain Loc --      Pain Edu? --      Excl. in GC? --     Constitutional: Alert and oriented x4 appears quite uncomfortable shielding her left eye in the dark Eyes: PERRL EOMI. mid range and brisk.  No papilledema appreciated on either eye with endoscopy Head: Atraumatic. Nose: No congestion/rhinnorhea. Mouth/Throat: No trismus Neck: No stridor.  No meningismus Cardiovascular: Normal rate, regular rhythm. Grossly normal heart sounds.  Good peripheral circulation. Respiratory:  Normal respiratory effort.  No retractions. Lungs CTAB and moving good air Gastrointestinal: Obese soft nontender Musculoskeletal: No lower extremity edema   Neurologic:  Normal speech and language. No gross focal neurologic deficits are appreciated. Skin:  Skin is warm, dry and intact. No rash noted. Psychiatric: Mood and affect are normal. Speech and behavior are normal.    ____________________________________________   DIFFERENTIAL includes but not limited to  Migraine headache, temporal arteritis, glaucoma, intracerebral mass, pseudotumor cerebri ____________________________________________   LABS (all labs ordered are listed, but only abnormal results are displayed)  Labs Reviewed - No data to display   __________________________________________  EKG   ____________________________________________  RADIOLOGY  Head CT reviewed by me with possible Chiari I malformation ____________________________________________   PROCEDURES  Procedure(s) performed: no  Procedures  Critical Care performed: no  Observation: no ____________________________________________   INITIAL IMPRESSION / ASSESSMENT AND PLAN / ED COURSE  Pertinent labs & imaging results that were available during my care of the patient were reviewed by me and considered in my medical decision making (see chart for details).  The patient arrives uncomfortable appearing with unilateral throbbing headache associated with photophobia.  She has a normal neuro exam.  Normal for endoscopy.  My primary concern is likely new onset of migraine headache.  Doubt meningitis or encephalitis.  As this is her first headache like this head CT obtained which is negative for acute pathology although does likely show chronic Chiari I malformation.  She feels improved after Compazine Toradol and Benadryl.  I will refer her to neurology as an outpatient and treat her symptomatically with triptan's.  Strict return precautions have  been given and the patient verbalized understanding agree with the plan.      ____________________________________________   FINAL CLINICAL IMPRESSION(S) / ED DIAGNOSES  Final diagnoses:  Nonintractable headache, unspecified chronicity pattern, unspecified headache type  Chiari malformation type I (HCC)      NEW MEDICATIONS STARTED DURING THIS VISIT:  Discharge Medication List as of 04/01/2017  3:27 AM    START taking these medications   Details  SUMAtriptan (IMITREX) 50 MG tablet Take 1 tablet (50 mg total) by mouth once as needed for migraine. May repeat in 2 hours if headache persists or recurs., Starting Thu 04/01/2017, Until Sat 04/02/2018, Print         Note:  This document was prepared using Dragon voice recognition software and may include unintentional dictation errors.     Merrily Brittleifenbark, Contina Strain, MD 04/01/17 2220

## 2017-04-01 NOTE — Discharge Instructions (Signed)
Fortunately today your head CT was reassuring.  Please begin taking over-the-counter magnesium every day to help prevent headaches in the future and make an appointment to follow-up with neurology for a recheck.  Return to the emergency department sooner for any new or worsening symptoms such as numbness, weakness, worsening headache, or for any other issues whatsoever.  It was a pleasure to take care of you today, and thank you for coming to our emergency department.  If you have any questions or concerns before leaving please ask the nurse to grab me and I'm more than happy to go through your aftercare instructions again.  If you were prescribed any opioid pain medication today such as Norco, Vicodin, Percocet, morphine, hydrocodone, or oxycodone please make sure you do not drive when you are taking this medication as it can alter your ability to drive safely.  If you have any concerns once you are home that you are not improving or are in fact getting worse before you can make it to your follow-up appointment, please do not hesitate to call 911 and come back for further evaluation.  Merrily Brittle, MD  Results for orders placed or performed during the hospital encounter of 03/24/15  Lipase, blood  Result Value Ref Range   Lipase 32 11 - 51 U/L  Comprehensive metabolic panel  Result Value Ref Range   Sodium 139 135 - 145 mmol/L   Potassium 3.5 3.5 - 5.1 mmol/L   Chloride 104 101 - 111 mmol/L   CO2 25 22 - 32 mmol/L   Glucose, Bld 118 (H) 65 - 99 mg/dL   BUN 15 6 - 20 mg/dL   Creatinine, Ser 6.04 0.44 - 1.00 mg/dL   Calcium 9.3 8.9 - 54.0 mg/dL   Total Protein 8.5 (H) 6.5 - 8.1 g/dL   Albumin 4.4 3.5 - 5.0 g/dL   AST 35 15 - 41 U/L   ALT 58 (H) 14 - 54 U/L   Alkaline Phosphatase 78 38 - 126 U/L   Total Bilirubin 0.9 0.3 - 1.2 mg/dL   GFR calc non Af Amer >60 >60 mL/min   GFR calc Af Amer >60 >60 mL/min   Anion gap 10 5 - 15  CBC  Result Value Ref Range   WBC 13.1 (H) 3.6 - 11.0  K/uL   RBC 5.31 (H) 3.80 - 5.20 MIL/uL   Hemoglobin 13.7 12.0 - 16.0 g/dL   HCT 98.1 19.1 - 47.8 %   MCV 78.2 (L) 80.0 - 100.0 fL   MCH 25.8 (L) 26.0 - 34.0 pg   MCHC 33.0 32.0 - 36.0 g/dL   RDW 29.5 62.1 - 30.8 %   Platelets 255 150 - 440 K/uL  Urinalysis complete, with microscopic ([facility] only)  Result Value Ref Range   Color, Urine YELLOW (A) YELLOW   APPearance CLEAR (A) CLEAR   Glucose, UA NEGATIVE NEGATIVE mg/dL   Bilirubin Urine NEGATIVE NEGATIVE   Ketones, ur NEGATIVE NEGATIVE mg/dL   Specific Gravity, Urine 1.027 1.005 - 1.030   Hgb urine dipstick NEGATIVE NEGATIVE   pH 6.0 5.0 - 8.0   Protein, ur NEGATIVE NEGATIVE mg/dL   Nitrite NEGATIVE NEGATIVE   Leukocytes, UA NEGATIVE NEGATIVE   RBC / HPF 0-5 0 - 5 RBC/hpf   WBC, UA 0-5 0 - 5 WBC/hpf   Bacteria, UA NONE SEEN NONE SEEN   Squamous Epithelial / LPF 0-5 (A) NONE SEEN   Mucus PRESENT   Lipase, blood  Result Value Ref Range  Lipase 32 11 - 51 U/L  Pregnancy, urine POC  Result Value Ref Range   Preg Test, Ur NEGATIVE NEGATIVE   Ct Head Wo Contrast  Result Date: 04/01/2017 CLINICAL DATA:  22 y/o F; headache and vomiting. Subarachnoid hemorrhage suspected, initial exam. EXAM: CT HEAD WITHOUT CONTRAST TECHNIQUE: Contiguous axial images were obtained from the base of the skull through the vertex without intravenous contrast. COMPARISON:  None. FINDINGS: Brain: No evidence of acute infarction, hemorrhage, hydrocephalus, extra-axial collection or mass lesion/mass effect. Possible low-lying cerebellar tonsils, incompletely visualized. Vascular: No hyperdense vessel or unexpected calcification. Skull: Normal. Negative for fracture or focal lesion. Sinuses/Orbits: No acute finding. Other: None. IMPRESSION: 1. No acute intracranial abnormality identified. 2. Possible low-lying cerebellar tonsils or Chiari 1 malformation, incompletely visualized. This can be further assessed with MRI of the brain on a nonemergent basis. Electronically  Signed   By: Mitzi HansenLance  Furusawa-Stratton M.D.   On: 04/01/2017 02:02

## 2017-08-09 IMAGING — US US MFM OB LIMITED
1 series · 7 of 7 positions shown · non-contrast
Comparison: none

[Series 1: us mfm ob limited · 0.28mm/px · 7 of 7 slices shown]
[im 1/7]
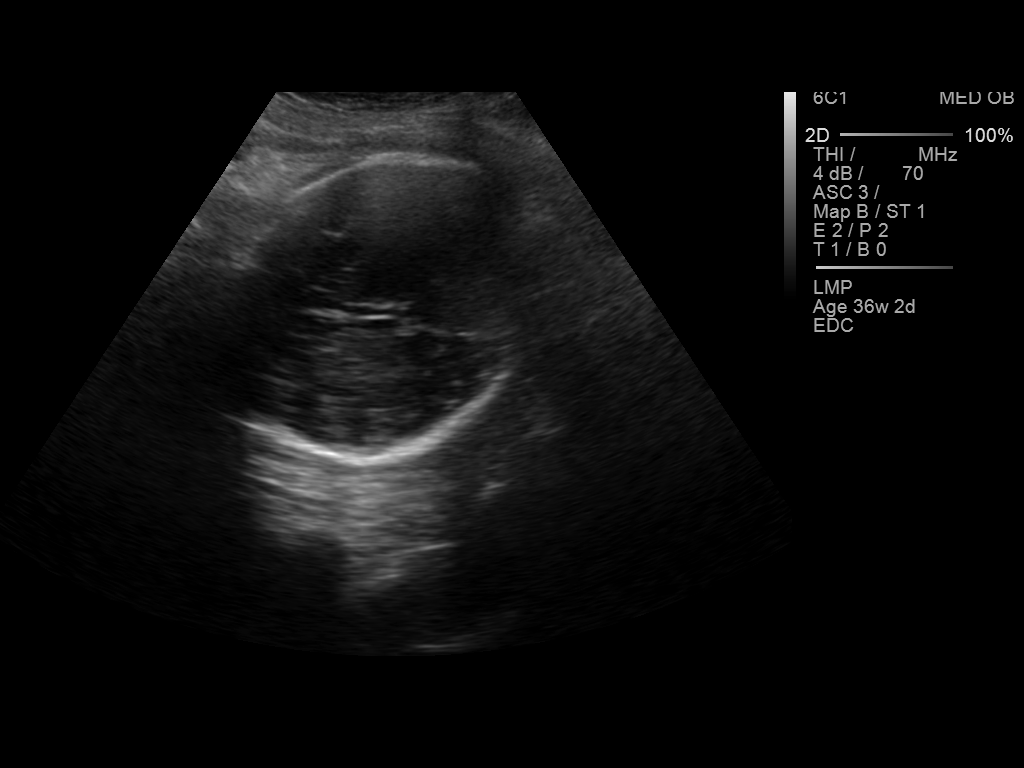
[im 2/7]
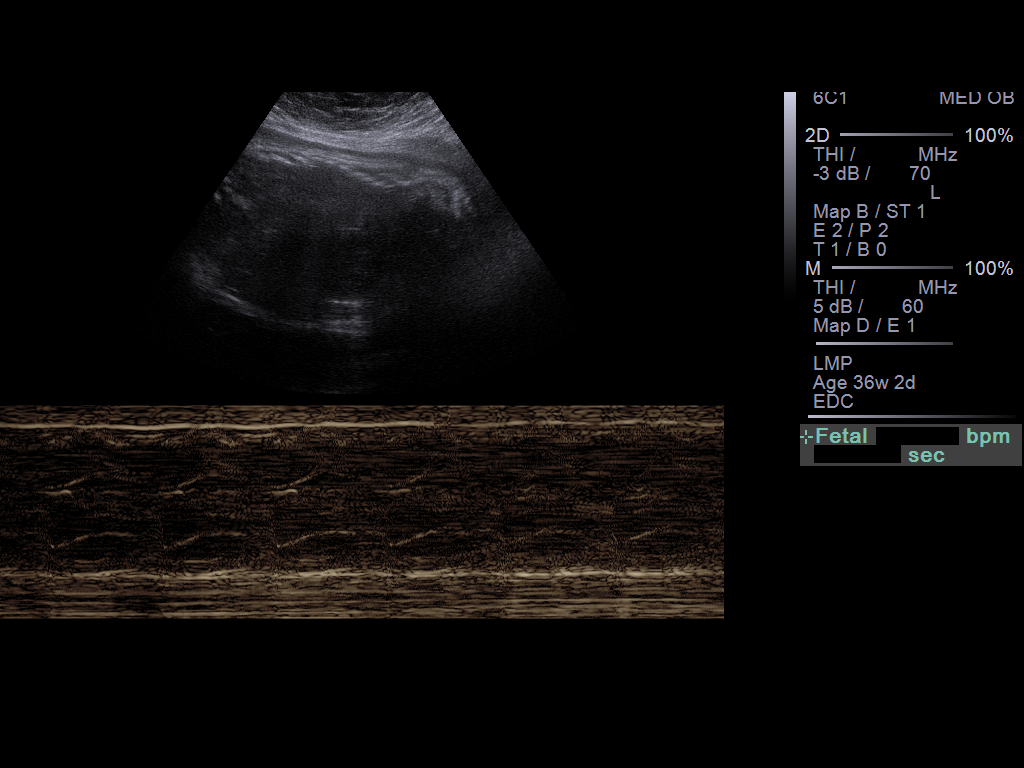
[im 3/7]
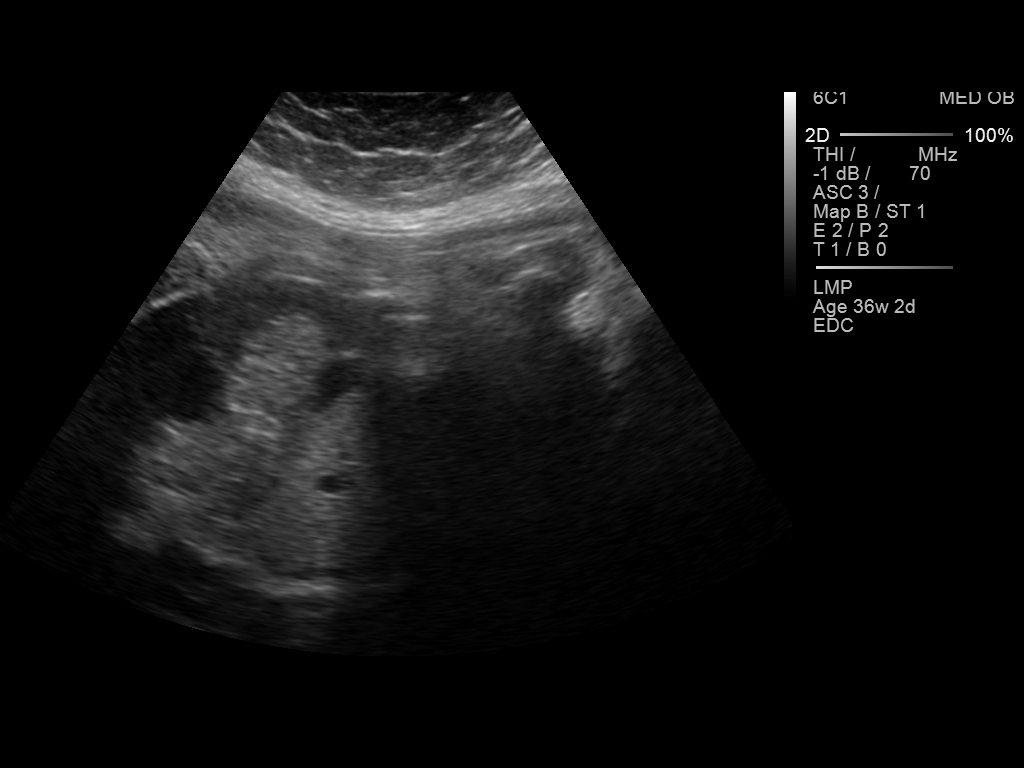
[im 4/7]
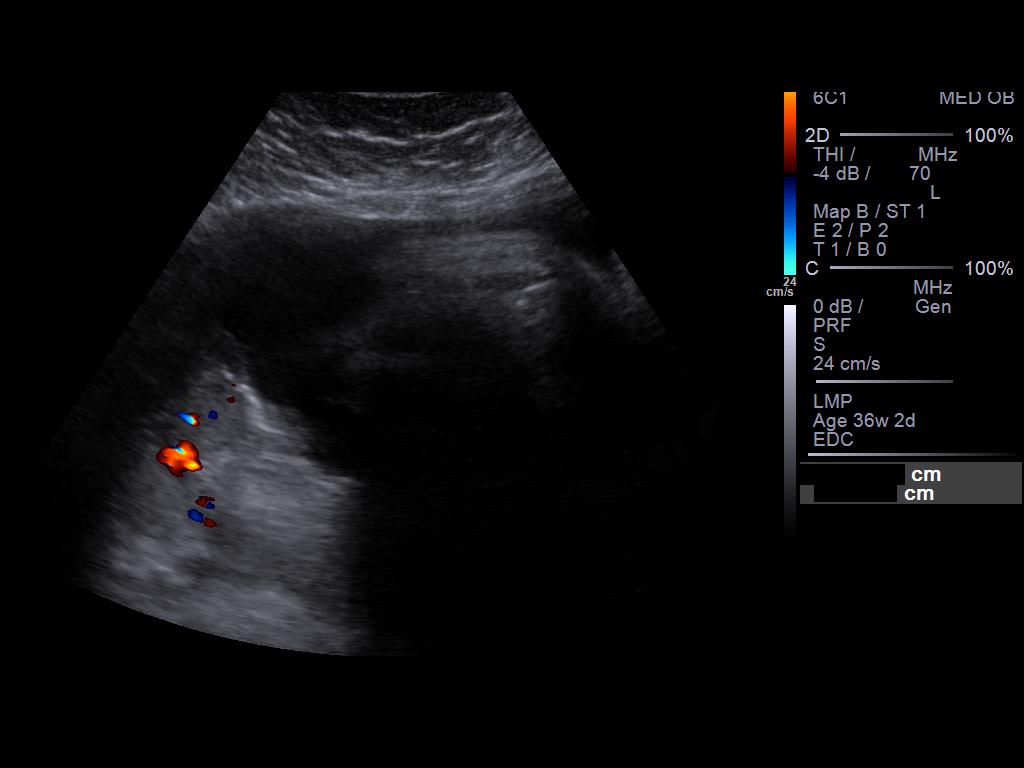
[im 5/7]
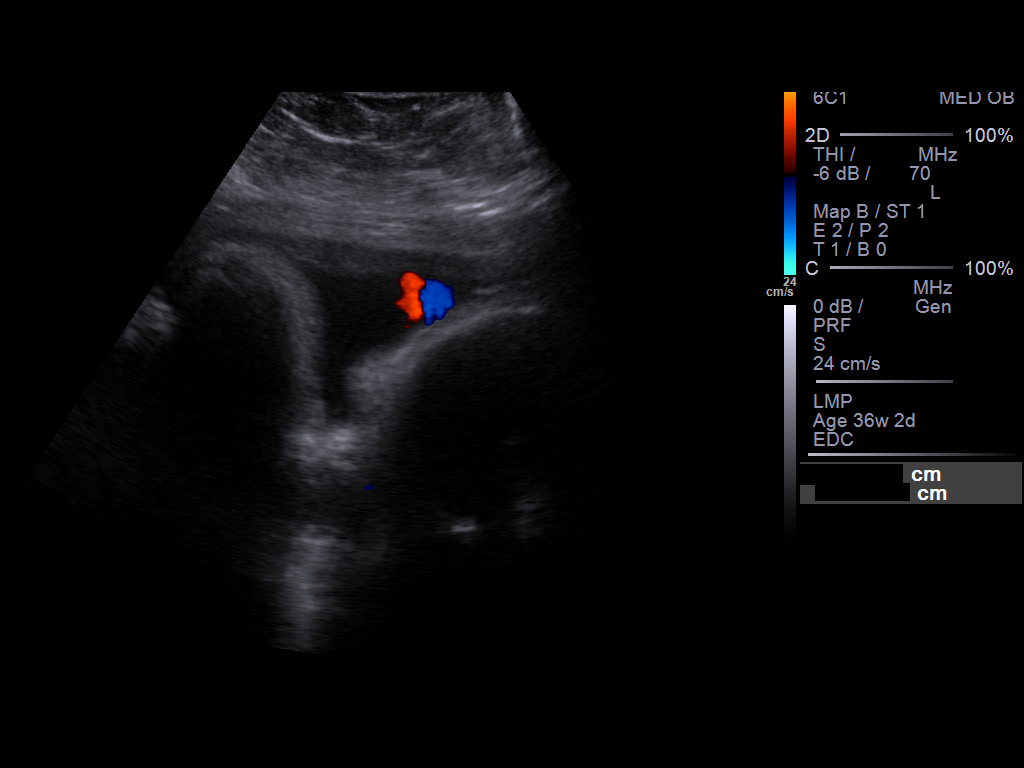
[im 6/7]
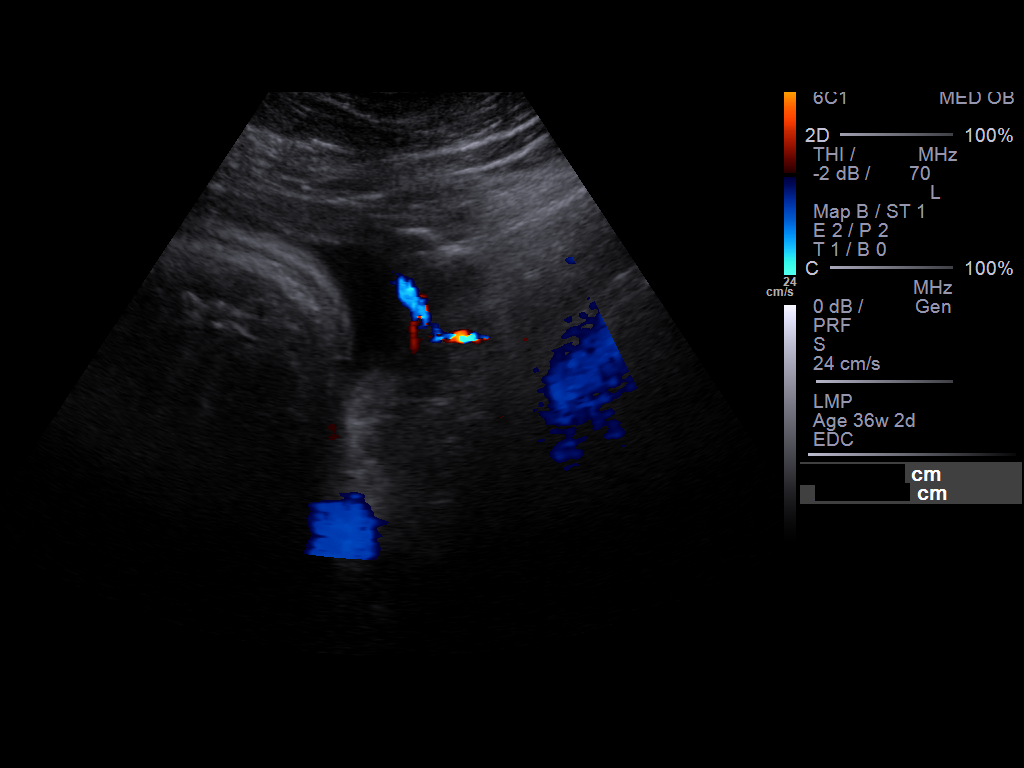
[im 7/7]
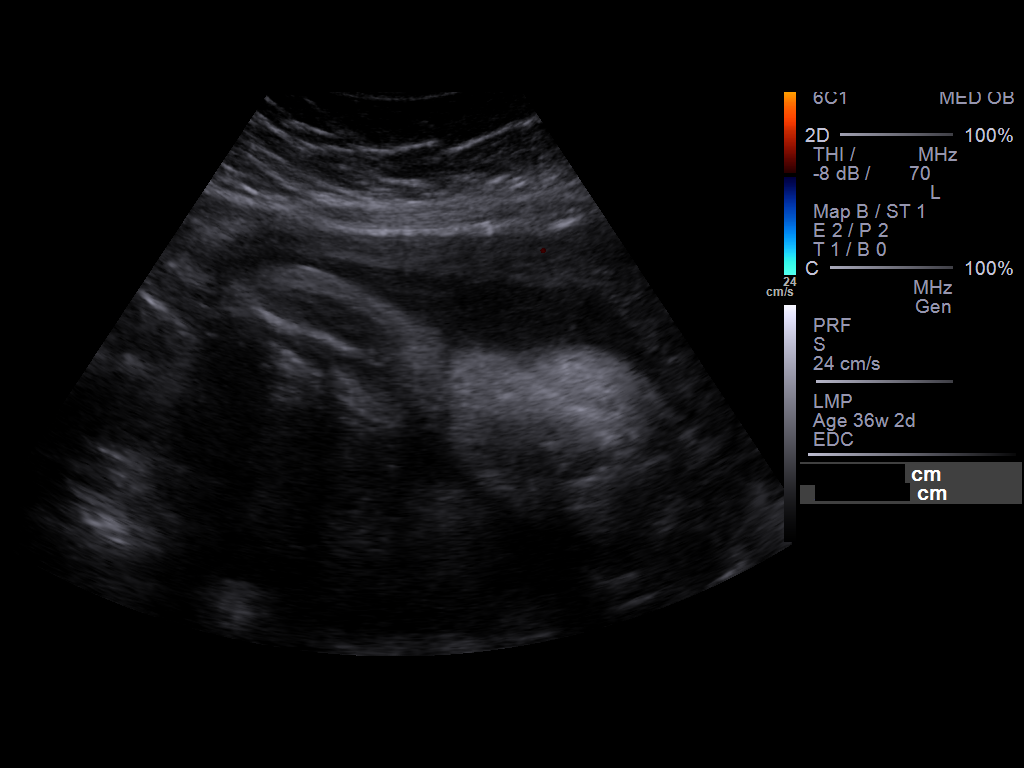

[7 of 7 positions shown; findings below may reference images not displayed]

Canned report from images found in remote index.

Refer to host system for actual result text.

## 2018-01-13 LAB — HM HIV SCREENING LAB: HM HIV Screening: NEGATIVE

## 2018-07-28 MED ORDER — VALACYCLOVIR 1 GRAM TABLET
ORAL_TABLET | Freq: Every day | ORAL | 0 refills | 0 days | Status: CP
Start: 2018-07-28 — End: ?

## 2018-07-28 MED ORDER — CHLORHEXIDINE GLUCONATE 4 % TOPICAL LIQUID
1 refills | 0 days | Status: CP
Start: 2018-07-28 — End: ?

## 2018-11-29 DIAGNOSIS — Z8679 Personal history of other diseases of the circulatory system: Secondary | ICD-10-CM

## 2018-11-29 DIAGNOSIS — Z6841 Body Mass Index (BMI) 40.0 and over, adult: Secondary | ICD-10-CM

## 2018-11-30 ENCOUNTER — Ambulatory Visit (LOCAL_COMMUNITY_HEALTH_CENTER): Payer: Self-pay | Admitting: Family Medicine

## 2018-11-30 ENCOUNTER — Encounter: Payer: Self-pay | Admitting: Family Medicine

## 2018-11-30 ENCOUNTER — Other Ambulatory Visit: Payer: Self-pay

## 2018-11-30 VITALS — BP 116/75 | Ht 61.0 in | Wt 241.2 lb

## 2018-11-30 DIAGNOSIS — Z30432 Encounter for removal of intrauterine contraceptive device: Secondary | ICD-10-CM

## 2018-11-30 DIAGNOSIS — Z3009 Encounter for other general counseling and advice on contraception: Secondary | ICD-10-CM

## 2018-11-30 NOTE — Progress Notes (Signed)
Here today for IUD removal. States will use condoms as an alternate birth control method.

## 2018-11-30 NOTE — Progress Notes (Signed)
   IUD Removal  Patient identified, informed consent performed, consent signed.  Patient was in the dorsal lithotomy position, normal external genitalia was noted.  A speculum was placed in the patient's vagina, normal discharge was noted, no lesions. The cervix was visualized, no lesions, no abnormal discharge.  The strings of the IUD were grasped and pulled using ring forceps. The IUD was removed in its entirety. Patient tolerated the procedure well.    Patient will use condoms for contraception Routine preventative health maintenance measures emphasized.

## 2018-12-23 ENCOUNTER — Ambulatory Visit: Admit: 2018-12-23 | Discharge: 2018-12-24

## 2018-12-23 DIAGNOSIS — Q828 Other specified congenital malformations of skin: Secondary | ICD-10-CM

## 2018-12-23 DIAGNOSIS — L732 Hidradenitis suppurativa: Secondary | ICD-10-CM

## 2018-12-23 MED ORDER — CLINDAMYCIN HCL 300 MG CAPSULE
ORAL_CAPSULE | 2 refills | 0 days | Status: CP
Start: 2018-12-23 — End: ?

## 2018-12-23 MED ORDER — SPIRONOLACTONE 50 MG TABLET
ORAL_TABLET | Freq: Every day | ORAL | 3 refills | 90 days | Status: CP
Start: 2018-12-23 — End: 2019-12-23

## 2018-12-23 MED ORDER — RIFAMPIN 300 MG CAPSULE
ORAL_CAPSULE | 2 refills | 0 days | Status: CP
Start: 2018-12-23 — End: ?

## 2020-01-16 ENCOUNTER — Ambulatory Visit: Admit: 2020-01-16 | Discharge: 2020-01-17 | Payer: MEDICAID

## 2020-11-06 LAB — OB RESULTS CONSOLE HEPATITIS B SURFACE ANTIGEN: Hepatitis B Surface Ag: NEGATIVE

## 2020-11-06 LAB — OB RESULTS CONSOLE RUBELLA ANTIBODY, IGM: Rubella: IMMUNE

## 2020-11-06 LAB — OB RESULTS CONSOLE VARICELLA ZOSTER ANTIBODY, IGG: Varicella: IMMUNE

## 2020-11-14 DIAGNOSIS — Z3491 Encounter for supervision of normal pregnancy, unspecified, first trimester: Principal | ICD-10-CM

## 2020-11-22 ENCOUNTER — Ambulatory Visit: Admit: 2020-11-22 | Discharge: 2020-11-23 | Payer: PRIVATE HEALTH INSURANCE

## 2021-01-10 ENCOUNTER — Ambulatory Visit: Admit: 2021-01-10 | Discharge: 2021-01-11 | Payer: PRIVATE HEALTH INSURANCE

## 2021-02-14 ENCOUNTER — Encounter: Payer: Self-pay | Admitting: Emergency Medicine

## 2021-02-14 DIAGNOSIS — O26892 Other specified pregnancy related conditions, second trimester: Secondary | ICD-10-CM | POA: Insufficient documentation

## 2021-02-14 DIAGNOSIS — Z87891 Personal history of nicotine dependence: Secondary | ICD-10-CM | POA: Diagnosis not present

## 2021-02-14 DIAGNOSIS — G43909 Migraine, unspecified, not intractable, without status migrainosus: Secondary | ICD-10-CM | POA: Insufficient documentation

## 2021-02-14 DIAGNOSIS — Z3A24 24 weeks gestation of pregnancy: Secondary | ICD-10-CM | POA: Diagnosis not present

## 2021-02-14 DIAGNOSIS — R1110 Vomiting, unspecified: Secondary | ICD-10-CM | POA: Insufficient documentation

## 2021-02-14 LAB — URINALYSIS, ROUTINE W REFLEX MICROSCOPIC
Bilirubin Urine: NEGATIVE
Glucose, UA: NEGATIVE mg/dL
Hgb urine dipstick: NEGATIVE
Ketones, ur: NEGATIVE mg/dL
Nitrite: NEGATIVE
Protein, ur: NEGATIVE mg/dL
Specific Gravity, Urine: 1.018 (ref 1.005–1.030)
WBC, UA: NONE SEEN WBC/hpf (ref 0–5)
pH: 7 (ref 5.0–8.0)

## 2021-02-14 LAB — CBC
HCT: 32 % — ABNORMAL LOW (ref 36.0–46.0)
Hemoglobin: 10.7 g/dL — ABNORMAL LOW (ref 12.0–15.0)
MCH: 28.6 pg (ref 26.0–34.0)
MCHC: 33.4 g/dL (ref 30.0–36.0)
MCV: 85.6 fL (ref 80.0–100.0)
Platelets: 258 10*3/uL (ref 150–400)
RBC: 3.74 MIL/uL — ABNORMAL LOW (ref 3.87–5.11)
RDW: 13.1 % (ref 11.5–15.5)
WBC: 13.1 10*3/uL — ABNORMAL HIGH (ref 4.0–10.5)
nRBC: 0 % (ref 0.0–0.2)

## 2021-02-14 NOTE — ED Triage Notes (Signed)
Pt is [redacted]wks pregnant, in with HA that began this morning, first noticed after her dental appointment. HA is sharp, and pain is posterior side of head. Emesis several times since this afternoon. Denies any abdominal or back pain.

## 2021-02-15 ENCOUNTER — Emergency Department
Admission: EM | Admit: 2021-02-15 | Discharge: 2021-02-15 | Disposition: A | Discharge status: Home / Self Care | Payer: 59 | Attending: Emergency Medicine | Admitting: Emergency Medicine

## 2021-02-15 DIAGNOSIS — O26892 Other specified pregnancy related conditions, second trimester: Secondary | ICD-10-CM | POA: Diagnosis not present

## 2021-02-15 DIAGNOSIS — G43909 Migraine, unspecified, not intractable, without status migrainosus: Secondary | ICD-10-CM

## 2021-02-15 LAB — COMPREHENSIVE METABOLIC PANEL
ALT: 16 U/L (ref 0–44)
AST: 18 U/L (ref 15–41)
Albumin: 2.9 g/dL — ABNORMAL LOW (ref 3.5–5.0)
Alkaline Phosphatase: 66 U/L (ref 38–126)
Anion gap: 7 (ref 5–15)
BUN: 9 mg/dL (ref 6–20)
CO2: 23 mmol/L (ref 22–32)
Calcium: 9.1 mg/dL (ref 8.9–10.3)
Chloride: 105 mmol/L (ref 98–111)
Creatinine, Ser: 0.41 mg/dL — ABNORMAL LOW (ref 0.44–1.00)
GFR, Estimated: >60 mL/min (ref >60)
Glucose, Bld: 122 mg/dL — ABNORMAL HIGH (ref 70–99)
Potassium: 3.3 mmol/L — ABNORMAL LOW (ref 3.5–5.1)
Sodium: 135 mmol/L (ref 135–145)
Total Bilirubin: 0.6 mg/dL (ref 0.3–1.2)
Total Protein: 7.1 g/dL (ref 6.5–8.1)

## 2021-02-15 LAB — LIPASE, BLOOD: Lipase: 36 U/L (ref 11–51)

## 2021-02-15 MED ORDER — SODIUM CHLORIDE 0.9 % IV BOLUS
1000.0000 mL | Freq: Once | INTRAVENOUS | Status: AC
  Administered 2021-02-15: 1000 mL via INTRAVENOUS

## 2021-02-15 MED ORDER — METOCLOPRAMIDE HCL 10 MG PO TABS: 10.0000 mg | ORAL_TABLET | Freq: Three times a day (TID) | ORAL | 0 refills | Status: DC | PRN

## 2021-02-15 MED ORDER — DIPHENHYDRAMINE HCL 50 MG/ML IJ SOLN
25.0000 mg | Freq: Once | INTRAMUSCULAR | Status: AC
  Administered 2021-02-15: 25 mg via INTRAVENOUS
  Filled 2021-02-15: qty 1

## 2021-02-15 MED ORDER — METOCLOPRAMIDE HCL 5 MG/ML IJ SOLN
10.0000 mg | Freq: Once | INTRAMUSCULAR | Status: AC
  Administered 2021-02-15: 10 mg via INTRAVENOUS
  Filled 2021-02-15: qty 2

## 2021-02-15 NOTE — Discharge Instructions (Addendum)
Return to the ER for new, worsening, or persistent severe headache, vomiting, abdominal pain, vaginal bleeding, or any other new or worsening symptoms that concern you.  Follow-up with your OB/GYN as scheduled.

## 2021-02-15 NOTE — ED Provider Notes (Signed)
[facility] Emergency Department Provider Note ____________________________________________   Event Date/Time   First MD Initiated Contact with Patient 02/15/21 0601     (approximate)  I have reviewed the triage vital signs and the nursing notes.   HISTORY  Chief Complaint [redacted] wks Pregnant, Headache, and Emesis    HPI Bethany Velez is a 25 y.o. female with PMH as noted below who is currently [redacted] weeks pregnant by ultrasound and presents with a headache, gradual onset yesterday around 3 PM after a dental appointment, increasing in intensity, and proceeded by and associated with several episodes of vomiting.  The patient describes the headache as throbbing.  It is mainly in the back of her head and is bilateral.  The patient states that she has had similar headaches once or twice in the past that were not quite as severe, and believe that she was having migraines.  She denies any abdominal pain, vaginal bleeding or discharge, leakage of fluid, or absent fetal movements.  She states that she actually feels the baby moving more than normal recently.  Past Medical History:  Diagnosis Date   Anemia    Heart murmur 25 years old    Patient Active Problem List   Diagnosis Date Noted   Infection of skin 07/08/2015   Obesity, unspecified 01/10/2015   Hx of cardiac murmur 10/31/2014   Rash and other nonspecific skin eruption 06/19/2013    Past Surgical History:  Procedure Laterality Date   NO PAST SURGERIES      Prior to Admission medications   Medication Sig Start Date End Date Taking? Authorizing Provider  metoCLOPramide (REGLAN) 10 MG tablet Take 1 tablet (10 mg total) by mouth every 8 (eight) hours as needed for up to 5 days for nausea (headache). 02/15/21 02/20/21 Yes Dionne Bucy, MD  ferrous fumarate (HEMOCYTE - 106 MG FE) 325 (106 FE) MG TABS tablet Take 1 tablet by mouth.    [provider]  levonorgestrel (MIRENA) 20 MCG/24HR IUD 1  each by Intrauterine route once. 01/29/15   Larene Pickett, FNP  ondansetron (ZOFRAN ODT) 4 MG disintegrating tablet Take 1 tablet (4 mg total) by mouth every 8 (eight) hours as needed for nausea or vomiting. Patient not taking: Reported on 11/30/2018 03/24/15   Rebecka Apley, MD  Prenatal Vit-Fe Fumarate-FA (PRENATAL MULTIVITAMIN) TABS tablet Take 1 tablet by mouth daily at 12 noon.    [provider]  sucralfate (CARAFATE) 1 g tablet Take 1 tablet (1 g total) by mouth 2 (two) times daily. Patient not taking: Reported on 11/30/2018 03/24/15   Rebecka Apley, MD  SUMAtriptan (IMITREX) 50 MG tablet Take 1 tablet (50 mg total) by mouth once as needed for migraine. May repeat in 2 hours if headache persists or recurs. 04/01/17 04/02/18  Merrily Brittle, MD    Allergies Patient has no known allergies.  No family history on file.  Social History Social History   Tobacco Use   Smoking status: Former   Smokeless tobacco: Never  Substance Use Topics   Alcohol use: No   Drug use: No    Review of Systems  Constitutional: No fever/chills Eyes: No visual changes. ENT: No sore throat. Cardiovascular: Denies chest pain. Respiratory: Denies shortness of breath. Gastrointestinal: Positive for vomiting. Genitourinary: Negative for dysuria.  Musculoskeletal: Negative for back pain. Skin: Negative for rash. Neurological: Positive for headache.   ____________________________________________   PHYSICAL EXAM:  VITAL SIGNS: ED Triage Vitals  Enc Vitals Group  BP 02/14/21 2247 125/60     Pulse Rate 02/14/21 2247 100     Resp 02/14/21 2247 20     Temp 02/14/21 2247 98 F (36.7 C)     Temp Source 02/14/21 2247 Oral     SpO2 02/14/21 2247 99 %     Weight 02/14/21 2248 226 lb (102.5 kg)     Height --      Head Circumference --      Peak Flow --      Pain Score 02/15/21 0714 4     Pain Loc --      Pain Edu? --      Excl. in GC? --     Constitutional: Alert and  oriented. Well appearing and in no acute distress. Eyes: Conjunctivae are normal.  EOMI.  PERRLA. Head: Atraumatic. Nose: No congestion/rhinnorhea. Mouth/Throat: Mucous membranes are moist.   Neck: Normal range of motion.  Supple.  No meningeal signs. Cardiovascular: Normal rate, regular rhythm.  Good peripheral circulation. Respiratory: Normal respiratory effort.  No retractions.  Gastrointestinal: Soft and nontender. No distention.  Genitourinary: No flank tenderness. Musculoskeletal: Extremities warm and well perfused.  Neurologic:  Normal speech and language.  Motor and sensory intact in all extremities.  Cranial nerves III through XII intact.  No ataxia, normal coordination finger-to-nose.  No pronator drift. Skin:  Skin is warm and dry. No rash noted. Psychiatric: Mood and affect are normal. Speech and behavior are normal.  ____________________________________________   LABS (all labs ordered are listed, but only abnormal results are displayed)  Labs Reviewed  COMPREHENSIVE METABOLIC PANEL - Abnormal; Notable for the following components:      Result Value   Potassium 3.3 (*)    Glucose, Bld 122 (*)    Creatinine, Ser 0.41 (*)    Albumin 2.9 (*)    All other components within normal limits  CBC - Abnormal; Notable for the following components:   WBC 13.1 (*)    RBC 3.74 (*)    Hemoglobin 10.7 (*)    HCT 32.0 (*)    All other components within normal limits  URINALYSIS, ROUTINE W REFLEX MICROSCOPIC - Abnormal; Notable for the following components:   Color, Urine YELLOW (*)    APPearance TURBID (*)    Leukocytes,Ua LARGE (*)    Bacteria, UA RARE (*)    All other components within normal limits  LIPASE, BLOOD  POC URINE PREG, ED   ____________________________________________  EKG   ____________________________________________  RADIOLOGY    ____________________________________________   PROCEDURES  Procedure(s) performed: No  Procedures  Critical Care  performed: No ____________________________________________   INITIAL IMPRESSION / ASSESSMENT AND PLAN / ED COURSE  Pertinent labs & imaging results that were available during my care of the patient were reviewed by me and considered in my medical decision making (see chart for details).   A 57-year-old female with PMH as noted above, currently [redacted] weeks pregnant by ultrasound presents with headache since yesterday afternoon which gradually increased in intensity and has been associated with nausea and vomiting.  It occurred after dental appointment.  She denies any specific pregnancy related symptoms.  On exam the patient is well-appearing.  Her vital signs are normal.  The physical exam is unremarkable.  She has no meningeal signs.  Neurologic exam is normal.  Overall presentation is most consistent with migraine headache versus occipital neuralgia.  Differential also includes hyperemesis gravidarum.  Lab work-up is unremarkable (except for mild leukocytosis which appears chronic for the  patient) and there is no clinical evidence of significant dehydration.  UA shows some leukocytes but rare bacteria and the patient has no urinary symptoms.  We will give fluids, Reglan, Benadryl, and reassess.  ----------------------------------------- 7:30 AM on 02/15/2021 -----------------------------------------  The patient is feeling significantly better after the fluids and Reglan.  I discussed the case with Dr. Valentino Saxon from OB/GYN who advised that the patient did not require fetal monitoring on labor and delivery.  The patient feels comfortable to go home.  I counseled her on the results of the work-up and on return precautions; she expressed understanding.   ____________________________________________   FINAL CLINICAL IMPRESSION(S) / ED DIAGNOSES  Final diagnoses:  Migraine without status migrainosus, not intractable, unspecified migraine type      NEW MEDICATIONS STARTED DURING THIS  VISIT:  Discharge Medication List as of 02/15/2021  7:12 AM     START taking these medications   Details  metoCLOPramide (REGLAN) 10 MG tablet Take 1 tablet (10 mg total) by mouth every 8 (eight) hours as needed for up to 5 days for nausea (headache)., Starting Sat 02/15/2021, Until Thu 02/20/2021 at 2359, Normal         Note:  This document was prepared using Dragon voice recognition software and may include unintentional dictation errors.    Dionne Bucy, MD 02/15/21 0730

## 2021-03-16 NOTE — L&D Delivery Note (Addendum)
Delivery Note ? ?Date of delivery: 06/04/2021 ?Estimated Date of Delivery: 06/04/21 ?No LMP recorded. Patient is pregnant. ?EGA: [redacted]w[redacted]d ? ?Delivery Note ?At 11:54 PM a viable female was delivered via Vaginal, Spontaneous (Presentation: Left Occiput Anterior).  APGAR: 8, 9; weight pending.  Placenta status: Spontaneous, Intact.  Cord: 3 vessels with the following complications: 30 sec shoulder dystocia..   ? ?First Stage: ?Labor onset: 1748 ?Augmentation : AROM and Pitocin ?Analgesia /Anesthesia intrapartum: Epidural ?AROM at 1816 ? ?Bethany Velez presented to L&D with active labor. She was augmented with pitocin. Epidural placed for pain relief.  ? ?Second Stage: ?Complete dilation at 2340 ?Onset of pushing at 2349 ?FHR second stage Cat I ?Delivery at 2354 on 06/05/2021 ? ?She progressed to complete and had a spontaneous vaginal birth of a live female over an intact perineum. The fetal head was delivered in OA position with restitution to LOA. No nuchal cord. Shoulder dystocia x 30sec, resolved after pulling legs straight out and the [place]ts. Anterior then posterior shoulders delivered. Baby placed on mom's abdomen and attended to by transition RN. Cord clamped and cut when pulseless by [name].  ? ?Third Stage: ?Placenta delivered intact with 3VC at 0000 ?Placenta disposition: routine disposal ?Uterine tone firm / bleeding min ?IV pitocin given for hemorrhage prophylaxis ? ?Anesthesia: Epidural ?Episiotomy: None ?Lacerations: None ?Suture Repair: n/a ?Est. Blood Loss (mL): 100 ? ?Complications: Shoulder dystocia ? ?Mom to postpartum.  Baby to Couplet care / Skin to Skin. ? ?Newborn: ?Birth Weight: pending  ?Apgar Scores: 8, 9 ?Feeding planned: Breastfeeding and bottle ? ? ?Cyril Mourning, CNM ?06/05/2021 12:10 AM ?  ?

## 2021-03-30 LAB — OB RESULTS CONSOLE GC/CHLAMYDIA
Chlamydia: NEGATIVE
Gonorrhea: NEGATIVE

## 2021-04-11 ENCOUNTER — Observation Stay
Admission: RE | Admit: 2021-04-11 | Discharge: 2021-04-11 | Disposition: A | Discharge status: Home / Self Care | Payer: Medicaid Other

## 2021-04-11 ENCOUNTER — Observation Stay
Admission: RE | Admit: 2021-04-11 | Payer: Medicaid Other | Source: Home / Self Care | Admitting: Obstetrics and Gynecology

## 2021-04-11 DIAGNOSIS — Z87891 Personal history of nicotine dependence: Secondary | ICD-10-CM | POA: Insufficient documentation

## 2021-04-11 DIAGNOSIS — O288 Other abnormal findings on antenatal screening of mother: Secondary | ICD-10-CM | POA: Diagnosis present

## 2021-04-11 DIAGNOSIS — E669 Obesity, unspecified: Secondary | ICD-10-CM | POA: Diagnosis not present

## 2021-04-11 DIAGNOSIS — Z3A33 33 weeks gestation of pregnancy: Secondary | ICD-10-CM | POA: Diagnosis not present

## 2021-04-11 DIAGNOSIS — O09893 Supervision of other high risk pregnancies, third trimester: Secondary | ICD-10-CM | POA: Insufficient documentation

## 2021-04-11 DIAGNOSIS — O99213 Obesity complicating pregnancy, third trimester: Principal | ICD-10-CM | POA: Insufficient documentation

## 2021-04-11 NOTE — Discharge Summary (Signed)
Jorgia Manthei is a 26 y.o. female. She is at [redacted]w[redacted]d gestation. No LMP recorded. Patient is pregnant. Estimated Date of Delivery: 05/27/21  Prenatal care site: [facility]  Chief complaint: non reactive NST at office   HPI: Taniah presents to L&D with complaints of nonreactive NST at office today.    Factors complicating pregnancy: Obesity in pregnancy   S: Resting comfortably. no CTX, no VB.no LOF,  Active fetal movement.   Maternal Medical History:  Past Medical Hx:  has a past medical history of Anemia and Heart murmur (26 years old).    Past Surgical Hx:  has a past surgical history that includes No past surgeries.   No Known Allergies   Prior to Admission medications   Medication Sig Start Date End Date Taking? Authorizing Provider  Prenatal Vit-Fe Fumarate-FA (PRENATAL MULTIVITAMIN) TABS tablet Take 1 tablet by mouth daily at 12 noon.   Yes [provider]  ferrous fumarate (HEMOCYTE - 106 MG FE) 325 (106 FE) MG TABS tablet Take 1 tablet by mouth.    [provider]  levonorgestrel (MIRENA) 20 MCG/24HR IUD 1 each by Intrauterine route once. 01/29/15   Larene Pickett, FNP  metoCLOPramide (REGLAN) 10 MG tablet Take 1 tablet (10 mg total) by mouth every 8 (eight) hours as needed for up to 5 days for nausea (headache). 02/15/21 02/20/21  Dionne Bucy, MD  ondansetron (ZOFRAN ODT) 4 MG disintegrating tablet Take 1 tablet (4 mg total) by mouth every 8 (eight) hours as needed for nausea or vomiting. Patient not taking: Reported on 11/30/2018 03/24/15   Rebecka Apley, MD  sucralfate (CARAFATE) 1 g tablet Take 1 tablet (1 g total) by mouth 2 (two) times daily. Patient not taking: Reported on 11/30/2018 03/24/15   Rebecka Apley, MD  SUMAtriptan (IMITREX) 50 MG tablet Take 1 tablet (50 mg total) by mouth once as needed for migraine. May repeat in 2 hours if headache persists or recurs. 04/01/17 04/02/18  Merrily Brittle, MD    Social History: She  reports  that she has quit smoking. She has never used smokeless tobacco. She reports that she does not drink alcohol and does not use drugs.  Family History: family history non-contributory ,no history of gyn cancers  Review of Systems: A full review of systems was performed and negative except as noted in the HPI.    O:  BP 118/60 (BP Location: Right Arm)    Pulse (!) 115    Temp 98.6 F (37 C) (Oral)    Resp 18    Ht 5\' 2"  (1.575 m)    Wt 109.3 kg    BMI 44.08 kg/m  No results found for this or any previous visit (from the past 48 hour(s)).   Constitutional: NAD, AAOx3  HE/ENT: extraocular movements grossly intact, moist mucous membranes CV: RRR PULM: nl respiratory effort Abd: gravid, non-tender, non-distended, soft  Ext: Non-tender, Nonedmeatous Psych: mood appropriate, speech normal Pelvic : deferred  Fetal Monitor: Baseline: 130 bpm Variability: moderate Accels: Present Decels: none Toco: none  Category: I   Assessment: 26 y.o. [redacted]w[redacted]d here for antenatal surveillance during pregnancy.  Principle diagnosis: Obesity in pregnancy, Reactive NST    Plan: Fetal Wellbeing: Reassuring Cat 1 tracing. Reactive NST  D/c home stable, precautions reviewed, follow-up as scheduled.   ----- [redacted]w[redacted]d, CNM Certified Nurse Midwife [facility]

## 2021-04-11 NOTE — OB Triage Note (Signed)
Discharge instructions, labor precautions, and follow-up care reviewed with patient. All questions answered. Patient verbalized understanding. Patient discharged ambulatory off the unit.

## 2021-04-11 NOTE — OB Triage Note (Signed)
Pt presents to unit for an NST sent from [facility]. Pt reports +FM and 2 sporadic ctx on her way to the hospital, denies vaginal bleeding or LOF.

## 2021-04-24 ENCOUNTER — Observation Stay
Admission: EM | Admit: 2021-04-24 | Discharge: 2021-04-24 | Disposition: A | Discharge status: Home / Self Care | Payer: 59 | Attending: Obstetrics and Gynecology | Admitting: Obstetrics and Gynecology

## 2021-04-24 ENCOUNTER — Encounter: Payer: Self-pay | Admitting: Obstetrics and Gynecology

## 2021-04-24 DIAGNOSIS — O26893 Other specified pregnancy related conditions, third trimester: Secondary | ICD-10-CM | POA: Diagnosis not present

## 2021-04-24 DIAGNOSIS — O4290 Premature rupture of membranes, unspecified as to length of time between rupture and onset of labor, unspecified weeks of gestation: Secondary | ICD-10-CM | POA: Diagnosis present

## 2021-04-24 DIAGNOSIS — Z3A35 35 weeks gestation of pregnancy: Secondary | ICD-10-CM | POA: Insufficient documentation

## 2021-04-24 DIAGNOSIS — M545 Low back pain, unspecified: Secondary | ICD-10-CM | POA: Diagnosis not present

## 2021-04-24 DIAGNOSIS — O212 Late vomiting of pregnancy: Secondary | ICD-10-CM | POA: Diagnosis not present

## 2021-04-24 DIAGNOSIS — O4193X0 Disorder of amniotic fluid and membranes, unspecified, third trimester, not applicable or unspecified: Principal | ICD-10-CM | POA: Insufficient documentation

## 2021-04-24 LAB — URINALYSIS, COMPLETE (UACMP) WITH MICROSCOPIC
Bacteria, UA: NONE SEEN
Bilirubin Urine: NEGATIVE
Glucose, UA: NEGATIVE mg/dL
Hgb urine dipstick: NEGATIVE
Ketones, ur: 20 mg/dL — AB
Nitrite: NEGATIVE
Protein, ur: NEGATIVE mg/dL
Specific Gravity, Urine: 1.015 (ref 1.005–1.030)
pH: 7 (ref 5.0–8.0)

## 2021-04-24 LAB — WET PREP, GENITAL
Clue Cells Wet Prep HPF POC: NONE SEEN
Sperm: NONE SEEN
Trich, Wet Prep: NONE SEEN
WBC, Wet Prep HPF POC: <10 (ref <10)
Yeast Wet Prep HPF POC: NONE SEEN

## 2021-04-24 LAB — RUPTURE OF MEMBRANE (ROM)PLUS: Rom Plus: NEGATIVE

## 2021-04-24 MED ORDER — LACTATED RINGERS IV BOLUS
500.0000 mL | Freq: Once | INTRAVENOUS | Status: AC
  Administered 2021-04-24: 500 mL via INTRAVENOUS

## 2021-04-24 MED ORDER — ACETAMINOPHEN 325 MG PO TABS: 650.0000 mg | ORAL_TABLET | ORAL | Status: DC | PRN

## 2021-04-24 MED ORDER — PANTOPRAZOLE SODIUM 40 MG IV SOLR
40.0000 mg | Freq: Once | INTRAVENOUS | Status: AC
  Administered 2021-04-24: 40 mg via INTRAVENOUS
  Filled 2021-04-24: qty 10

## 2021-04-24 MED ORDER — ONDANSETRON HCL 4 MG/2ML IJ SOLN
4.0000 mg | Freq: Four times a day (QID) | INTRAMUSCULAR | Status: DC | PRN
  Administered 2021-04-24: 4 mg via INTRAVENOUS
  Filled 2021-04-24: qty 2

## 2021-04-24 MED ORDER — LACTATED RINGERS IV SOLN: INTRAVENOUS | Status: DC

## 2021-04-24 NOTE — OB Triage Note (Signed)
Patient is a G2P1 at [redacted]w[redacted]d who presents to unit c/o lower vaginal pressure when she stands and LOF. She reports she lost her mucous plug earlier this morning and has had clear watery discharge since then. Also reports having hot flashes, spots in vision for a moment, emesis x3 and a HA. Had intercourse last night. Reports +FM, denies vaginal bleeding. External monitors applied and assessing. Initial FHT 125.

## 2021-04-24 NOTE — Discharge Summary (Signed)
Patient ID: Bethany Velez MRN: [id number] DOB/AGE: Dec 18, 1995 25 y.o.  Admit date: 04/24/2021 Discharge date: 04/24/2021  Admission Diagnoses: 26 yo G2P1 at [redacted]w[redacted]d with c/o leaking fluid, N/V and lower back pain. Pt has also complained of heartburn in the pregnancy.  Pt report IC last night.  Currently getting prenatal care from [facility].  Discharge Diagnoses: Feeling better and negative labs  Factors complicating pregnancy: H/o Chlamydia N/V Vulvar warts Generalized Anxiety HSV GERD Obesity  Prenatal Procedures: NST  Consults: None  Significant Diagnostic Studies:  Results for orders placed or performed during the hospital encounter of 04/24/21 (from the past 168 hour(s))  Wet prep, genital   Collection Time: 04/24/21  1:15 PM   Specimen: Urine, Clean Catch  Result Value Ref Range   Yeast Wet Prep HPF POC NONE SEEN NONE SEEN   Trich, Wet Prep NONE SEEN NONE SEEN   Clue Cells Wet Prep HPF POC NONE SEEN NONE SEEN   WBC, Wet Prep HPF POC <10 <10   Sperm NONE SEEN   Urinalysis, Complete w Microscopic Urine, Clean Catch   Collection Time: 04/24/21  1:15 PM  Result Value Ref Range   Color, Urine YELLOW (A) YELLOW   APPearance HAZY (A) CLEAR   Specific Gravity, Urine 1.015 1.005 - 1.030   pH 7.0 5.0 - 8.0   Glucose, UA NEGATIVE NEGATIVE mg/dL   Hgb urine dipstick NEGATIVE NEGATIVE   Bilirubin Urine NEGATIVE NEGATIVE   Ketones, ur 20 (A) NEGATIVE mg/dL   Protein, ur NEGATIVE NEGATIVE mg/dL   Nitrite NEGATIVE NEGATIVE   Leukocytes,Ua SMALL (A) NEGATIVE   RBC / HPF 0-5 0 - 5 RBC/hpf   WBC, UA 0-5 0 - 5 WBC/hpf   Bacteria, UA NONE SEEN NONE SEEN   Squamous Epithelial / LPF 6-10 0 - 5   Mucus PRESENT    Hyaline Casts, UA PRESENT   ROM Plus ([facility] only)   Collection Time: 04/24/21  1:15 PM  Result Value Ref Range   Rom Plus NEGATIVE     Treatments: IV hydration and antiemetic  Hospital Course:  This is a 26 y.o. G2P1001 with IUP at [redacted]w[redacted]d seen for c/o leaking fluid,  N/V and lower back pain.  No no bleeding.  She was observed, fetal heart rate monitoring remained reassuring, and she had no signs/symptoms of labor or other maternal-fetal concerns.  She was deemed stable for discharge to home with outpatient follow up.  Discharge Physical Exam:  BP 117/69 (BP Location: Left Arm)    Pulse 100    Temp 98.5 F (36.9 C) (Oral)    Resp 16    Ht 5\' 2"  (1.575 m)    Wt 109.3 kg    BMI 44.08 kg/m   General: NAD CV: RRR Pulm: nl effort ABD: s/nd/nt, gravid DVT Evaluation: LE non-ttp, no evidence of DVT on exam.  NST: FHR baseline: 120 bpm Variability: moderate Accelerations: yes Decelerations: none Category/reactivity: reactive  TOCO: quiet SVE: deferred      Discharge Condition: Stable  Disposition:  Discharge disposition: 01-Home or Self Care        Allergies as of 04/24/2021   No Known Allergies      Medication List     TAKE these medications    ferrous fumarate 325 (106 Fe) MG Tabs tablet Commonly known as: HEMOCYTE - 106 mg FE Take 1 tablet by mouth.   levonorgestrel 20 MCG/24HR IUD Commonly known as: MIRENA 1 each by Intrauterine route once.   metoCLOPramide 10  MG tablet Commonly known as: REGLAN Take 1 tablet (10 mg total) by mouth every 8 (eight) hours as needed for up to 5 days for nausea (headache).   ondansetron 4 MG disintegrating tablet Commonly known as: Zofran ODT Take 1 tablet (4 mg total) by mouth every 8 (eight) hours as needed for nausea or vomiting.   prenatal multivitamin Tabs tablet Take 1 tablet by mouth daily at 12 noon.   sucralfate 1 g tablet Commonly known as: Carafate Take 1 tablet (1 g total) by mouth 2 (two) times daily.   SUMAtriptan 50 MG tablet Commonly known as: Imitrex Take 1 tablet (50 mg total) by mouth once as needed for migraine. May repeat in 2 hours if headache persists or recurs.         SignedHaroldine Laws, CNM 04/24/2021 2:19 PM

## 2021-04-24 NOTE — OB Triage Note (Signed)
Discharge instructions, labor precautions, and follow-up care reviewed with patient. All questions answered. Patient verbalized understanding. Discharged ambulatory off unit.   

## 2021-04-26 LAB — OB RESULTS CONSOLE HIV ANTIBODY (ROUTINE TESTING): HIV: NONREACTIVE

## 2021-04-26 LAB — OB RESULTS CONSOLE RPR: RPR: NONREACTIVE

## 2021-05-02 DIAGNOSIS — Z3400 Encounter for supervision of normal first pregnancy, unspecified trimester: Principal | ICD-10-CM

## 2021-05-02 DIAGNOSIS — O26843 Uterine size-date discrepancy, third trimester: Principal | ICD-10-CM

## 2021-05-02 DIAGNOSIS — Z3483 Encounter for supervision of other normal pregnancy, third trimester: Secondary | ICD-10-CM | POA: Diagnosis not present

## 2021-05-09 DIAGNOSIS — Z3483 Encounter for supervision of other normal pregnancy, third trimester: Secondary | ICD-10-CM | POA: Diagnosis not present

## 2021-05-10 DIAGNOSIS — Z3483 Encounter for supervision of other normal pregnancy, third trimester: Secondary | ICD-10-CM | POA: Diagnosis not present

## 2021-05-12 ENCOUNTER — Ambulatory Visit: Admit: 2021-05-12 | Discharge: 2021-05-13 | Payer: PRIVATE HEALTH INSURANCE

## 2021-05-13 LAB — OB RESULTS CONSOLE GBS: GBS: NEGATIVE

## 2021-05-16 DIAGNOSIS — Z3483 Encounter for supervision of other normal pregnancy, third trimester: Secondary | ICD-10-CM | POA: Diagnosis not present

## 2021-05-16 DIAGNOSIS — O99013 Anemia complicating pregnancy, third trimester: Secondary | ICD-10-CM | POA: Diagnosis not present

## 2021-05-23 ENCOUNTER — Encounter: Payer: Self-pay | Admitting: Obstetrics and Gynecology

## 2021-05-23 ENCOUNTER — Observation Stay
Admission: EM | Admit: 2021-05-23 | Discharge: 2021-05-23 | Disposition: A | Discharge status: Home / Self Care | Payer: Medicaid Other | Attending: Certified Nurse Midwife | Admitting: Certified Nurse Midwife

## 2021-05-23 ENCOUNTER — Other Ambulatory Visit: Payer: Self-pay

## 2021-05-23 DIAGNOSIS — O288 Other abnormal findings on antenatal screening of mother: Secondary | ICD-10-CM | POA: Diagnosis present

## 2021-05-23 DIAGNOSIS — O368330 Maternal care for abnormalities of the fetal heart rate or rhythm, third trimester, not applicable or unspecified: Secondary | ICD-10-CM | POA: Diagnosis not present

## 2021-05-23 DIAGNOSIS — O99891 Other specified diseases and conditions complicating pregnancy: Secondary | ICD-10-CM | POA: Insufficient documentation

## 2021-05-23 DIAGNOSIS — Z130 Encounter for screening for diseases of the blood and blood-forming organs and certain disorders involving the immune mechanism: Secondary | ICD-10-CM | POA: Diagnosis not present

## 2021-05-23 DIAGNOSIS — R7303 Prediabetes: Secondary | ICD-10-CM | POA: Insufficient documentation

## 2021-05-23 DIAGNOSIS — Z3A39 39 weeks gestation of pregnancy: Secondary | ICD-10-CM | POA: Insufficient documentation

## 2021-05-23 NOTE — Discharge Summary (Signed)
Patient ID: ?Burna Forts ?MRN: [id number] ?DOB/AGE: 1995-08-29 26 y.o. ? ?Admit date: 05/23/2021 ?Discharge date: 05/23/2021 ? ?Admission Diagnoses: Non-reactive NST in the office ? ?Discharge Diagnoses: Reactive NST ? ?Factors complicating pregnancy: ?Anemia ?Obesity ?Generalized anxiety  ?Chiari malformation ?HSV ?Fatty liver disease ?GERD ?Prediabetes ?Alcohol abuse ? ?Prenatal Procedures: NST ? ?Consults: None ? ?Significant Diagnostic Studies:  ?No results found for this or any previous visit (from the past 168 hour(s)). ? ?Treatments: none ? ?Hospital Course:  ?This is a 26 y.o. G2P1001 with IUP at [redacted]w[redacted]d seen d/t a non-reactive NST in the office.  NST was reactive. She was observed, fetal heart rate monitoring remained reassuring, and she had no signs/symptoms of labor or other maternal-fetal concerns.  She was deemed stable for discharge to home with outpatient follow up. ? ?Discharge Physical Exam:  ?BP 104/78 (BP Location: Left Arm)   Pulse (!) 104   Temp 98.7 ?F (37.1 ?C) (Oral)   Resp 16   Ht 5\' 2"  (1.575 m)   Wt 110.2 kg   BMI 44.45 kg/m?  ? ?General: NAD ?CV: RRR ?Pulm: nl effort ?ABD: s/nd/nt, gravid ?DVT Evaluation: LE non-ttp, no evidence of DVT on exam. ? ?NST: ?FHR baseline: 125 bpm ?Variability: moderate ?Accelerations: yes ?Decelerations: none ?Time: 20 minutes ?Category/reactivity: reactive ? ?TOCO: quiet ?SVE: deferred  ?  ? ? ?Discharge Condition: Stable ? ?Disposition:  ?Discharge disposition: 01-Home or Self Care ? ? ? ? ? ? ? ?Allergies as of 05/23/2021   ?No Known Allergies ?  ? ?  ?Medication List  ?  ? ?TAKE these medications   ? ?ferrous fumarate 325 (106 Fe) MG Tabs tablet ?Commonly known as: HEMOCYTE - 106 mg FE ?Take 1 tablet by mouth. ?  ?levonorgestrel 20 MCG/24HR IUD ?Commonly known as: MIRENA ?1 each by Intrauterine route once. ?  ?metoCLOPramide 10 MG tablet ?Commonly known as: REGLAN ?Take 1 tablet (10 mg total) by mouth every 8 (eight) hours as needed for up to 5 days  for nausea (headache). ?  ?ondansetron 4 MG disintegrating tablet ?Commonly known as: Zofran ODT ?Take 1 tablet (4 mg total) by mouth every 8 (eight) hours as needed for nausea or vomiting. ?  ?prenatal multivitamin Tabs tablet ?Take 1 tablet by mouth daily at 12 noon. ?  ?sucralfate 1 g tablet ?Commonly known as: Carafate ?Take 1 tablet (1 g total) by mouth 2 (two) times daily. ?  ?SUMAtriptan 50 MG tablet ?Commonly known as: Imitrex ?Take 1 tablet (50 mg total) by mouth once as needed for migraine. May repeat in 2 hours if headache persists or recurs. ?  ? ?  ? ? ? ?Signed: ? ?07/23/2021, CNM ?05/23/2021 1:46 PM ? ?  ?

## 2021-05-23 NOTE — OB Triage Note (Signed)
Pt Bethany Velez 26 y.o. presents to labor and delivery triage reporting that [facility] sent her for an NST. Pt is a G2P1001 at [redacted]w[redacted]d. Pt denies signs and symptoms consistent with rupture of membranes or active vaginal bleeding. Pt denies regular contractions and states positive fetal movement. External FM and TOCO applied to non-tender abdomen and assessing. Initial FHR . Vital signs obtained and within normal limits. Provider notified of pt. ? ?

## 2021-05-23 NOTE — Progress Notes (Signed)
?   05/23/21 1329  ?Fetal Heart Rate A  ?Mode External  ?Baseline Rate (A) 130 bpm  ?Variability 6-25 BPM  ?Accelerations 15 x 15  ?Decelerations None  ?Uterine Activity  ?Mode Toco  ?Contraction Frequency (min) ctx x2 with UI  ?Contraction Duration (sec) 90-110  ?Contraction Quality Mild  ?Resting Tone Palpated Relaxed  ?Resting Time Adequate  ? ?Reactive NST. Provider reviewed strip. ?

## 2021-05-23 NOTE — OB Triage Note (Signed)

## 2021-05-30 DIAGNOSIS — R030 Elevated blood-pressure reading, without diagnosis of hypertension: Secondary | ICD-10-CM | POA: Diagnosis not present

## 2021-05-30 DIAGNOSIS — Z3483 Encounter for supervision of other normal pregnancy, third trimester: Secondary | ICD-10-CM | POA: Diagnosis not present

## 2021-05-30 DIAGNOSIS — Z130 Encounter for screening for diseases of the blood and blood-forming organs and certain disorders involving the immune mechanism: Secondary | ICD-10-CM | POA: Diagnosis not present

## 2021-05-30 DIAGNOSIS — O99013 Anemia complicating pregnancy, third trimester: Secondary | ICD-10-CM | POA: Diagnosis not present

## 2021-05-31 DIAGNOSIS — Z3483 Encounter for supervision of other normal pregnancy, third trimester: Secondary | ICD-10-CM | POA: Diagnosis not present

## 2021-06-02 ENCOUNTER — Other Ambulatory Visit: Payer: Self-pay | Admitting: Certified Nurse Midwife

## 2021-06-02 NOTE — Progress Notes (Signed)
G2P1001 at [redacted]w[redacted]d, LMP of 08/20/20, NOT c/w early [place] at 12w.  ?Scheduled for induction of labor for elective on 06/07/2021.  ? ?Prenatal provider: [facility] ?Pregnancy complicated by: ?BMI 40 ?Anemia ?Heart murmur ?Chlamydia (10/2020) ?Chiari Malformation, type 1 ?HSV ?Fatty liver disease ?GERD ?Hidradenitis suppurativa ?Alcohol abuse (2015) ? ?Prenatal Labs: ?Blood type/Rh A pos  ?Antibody screen neg  ?Rubella Immune  ?Varicella Immune  ?RPR NR  ?HBsAg Neg  ?HIV NR  ?GC neg  ?Chlamydia neg  ?Genetic screening none  ?1 hour GTT 79  ?3 hour GTT N/a  ?GBS neg  ? ?Tdap: 03/28/21 ?Flu: declined ?Contraception: Paragard IUD ?Feeding preference: breast and bottle ? ?____ ?Janyce Llanos, CNM ? ?Certified Nurse Midwife ?[facility] ?[facility]  ? ?

## 2021-06-03 ENCOUNTER — Other Ambulatory Visit: Payer: Self-pay

## 2021-06-03 ENCOUNTER — Observation Stay
Admission: EM | Admit: 2021-06-03 | Discharge: 2021-06-03 | Disposition: A | Discharge status: Home / Self Care | Payer: 59 | Source: Home / Self Care | Admitting: Obstetrics and Gynecology

## 2021-06-03 ENCOUNTER — Encounter: Payer: Self-pay | Admitting: Obstetrics and Gynecology

## 2021-06-03 DIAGNOSIS — Z3490 Encounter for supervision of normal pregnancy, unspecified, unspecified trimester: Secondary | ICD-10-CM

## 2021-06-03 DIAGNOSIS — Z3A39 39 weeks gestation of pregnancy: Secondary | ICD-10-CM | POA: Insufficient documentation

## 2021-06-03 DIAGNOSIS — Z87891 Personal history of nicotine dependence: Secondary | ICD-10-CM | POA: Insufficient documentation

## 2021-06-03 DIAGNOSIS — O471 False labor at or after 37 completed weeks of gestation: Secondary | ICD-10-CM | POA: Insufficient documentation

## 2021-06-03 DIAGNOSIS — O479 False labor, unspecified: Secondary | ICD-10-CM | POA: Diagnosis present

## 2021-06-03 LAB — URINALYSIS, ROUTINE W REFLEX MICROSCOPIC
Bacteria, UA: NONE SEEN
Bilirubin Urine: NEGATIVE
Glucose, UA: NEGATIVE mg/dL
Ketones, ur: NEGATIVE mg/dL
Nitrite: NEGATIVE
Protein, ur: NEGATIVE mg/dL
Specific Gravity, Urine: 1.013 (ref 1.005–1.030)
pH: 7 (ref 5.0–8.0)

## 2021-06-03 MED ORDER — ACETAMINOPHEN 500 MG PO TABS: 1000.0000 mg | ORAL_TABLET | Freq: Four times a day (QID) | ORAL | Status: DC | PRN

## 2021-06-03 MED ORDER — ACETAMINOPHEN 325 MG PO TABS: 650.0000 mg | ORAL_TABLET | ORAL | Status: DC | PRN

## 2021-06-03 MED ORDER — ACETAMINOPHEN 500 MG PO TABS
ORAL_TABLET | ORAL | Status: AC
  Administered 2021-06-03: 1000 mg via ORAL
  Filled 2021-06-03: qty 2

## 2021-06-03 NOTE — OB Triage Note (Signed)
Pt is a G2P1 at [redacted]w[redacted]d presenting to L&D triage c/o ctx q2-5 minutes rating them a 8/10. Pt also reports vaginal bleeding before arriving but states she did not have any on arrival. She describes it as brown and :mucousy." Pt endorses increased urgency and frequency with urination and states she will have the urge to pee but only a little comes out. Pt denies LOF. +FM. Monitors applied and assessing.  ?

## 2021-06-03 NOTE — Discharge Summary (Signed)
Bethany Velez is a 26 y.o. female. She is at [redacted]w[redacted]d gestation. No LMP recorded. Patient is pregnant. ?Estimated Date of Delivery: 06/04/21 ? ?[facility] care site: [facility]  ? ?Current pregnancy complicated by:  ?BMI 40 ?Anemia ?Heart murmur ?Chlamydia (10/2020) ?Chiari Malformation, type 1 ?HSV ?Fatty liver disease ?GERD ?Hidradenitis suppurativa ?Alcohol abuse (2015) ? ?Chief complaint: uterine contractions ? ?She reports uterine contractions that started yesterday and persisted throughout the day and evening. She rates them 8/10 and states they start in the front and wrap around to the back. She reports good fetal movement.  ? ?S: Resting comfortably. no VB.no LOF,  Active fetal movement. Denies: HA, visual changes, SOB, or RUQ/epigastric pain ? ?Maternal Medical History:  ? ?Past Medical History:  ?Diagnosis Date  ? Anemia   ? Heart murmur 26 years old  ? ? ?Past Surgical History:  ?Procedure Laterality Date  ? NO PAST SURGERIES    ? ? ?No Known Allergies ? ?Prior to Admission medications   ?Medication Sig Start Date End Date Taking? Authorizing Provider  ?ferrous fumarate (HEMOCYTE - 106 MG FE) 325 (106 FE) MG TABS tablet Take 1 tablet by mouth.    [provider]  ?levonorgestrel (MIRENA) 20 MCG/24HR IUD 1 each by Intrauterine route once. 01/29/15   Larene Pickett, FNP  ?metoCLOPramide (REGLAN) 10 MG tablet Take 1 tablet (10 mg total) by mouth every 8 (eight) hours as needed for up to 5 days for nausea (headache). 02/15/21 02/20/21  Dionne Bucy, MD  ?ondansetron (ZOFRAN ODT) 4 MG disintegrating tablet Take 1 tablet (4 mg total) by mouth every 8 (eight) hours as needed for nausea or vomiting. 03/24/15   Rebecka Apley, MD  ?Prenatal Vit-Fe Fumarate-FA (PRENATAL MULTIVITAMIN) TABS tablet Take 1 tablet by mouth daily at 12 noon.    [provider]  ?sucralfate (CARAFATE) 1 g tablet Take 1 tablet (1 g total) by mouth 2 (two) times daily. ?Patient not taking: Reported on 11/30/2018  03/24/15   Rebecka Apley, MD  ?SUMAtriptan (IMITREX) 50 MG tablet Take 1 tablet (50 mg total) by mouth once as needed for migraine. May repeat in 2 hours if headache persists or recurs. 04/01/17 04/02/18  Merrily Brittle, MD  ? ? ? ? ?Social History: She  reports that she has quit smoking. She has never used smokeless tobacco. She reports that she does not currently use alcohol. She reports that she does not use drugs. ? ?Family History: family history is not on file.  no history of gyn cancers ? ?Review of Systems: A full review of systems was performed and negative except as noted in the HPI.   ? ? ?O: ? BP 120/73 (BP Location: Right Arm)   Pulse 90   Temp 98.3 ?F (36.8 ?C) (Oral)   Resp 18   Ht 5\' 2"  (1.575 m)   Wt 111.6 kg   BMI 44.99 kg/m?  ?Results for orders placed or performed during the hospital encounter of 06/03/21 (from the past 48 hour(s))  ?Urinalysis, Routine w reflex microscopic Urine, Clean Catch  ? Collection Time: 06/03/21  1:45 AM  ?Result Value Ref Range  ? Color, Urine YELLOW (A) YELLOW  ? APPearance HAZY (A) CLEAR  ? Specific Gravity, Urine 1.013 1.005 - 1.030  ? pH 7.0 5.0 - 8.0  ? Glucose, UA NEGATIVE NEGATIVE mg/dL  ? Hgb urine dipstick LARGE (A) NEGATIVE  ? Bilirubin Urine NEGATIVE NEGATIVE  ? Ketones, ur NEGATIVE NEGATIVE mg/dL  ? Protein, ur NEGATIVE  NEGATIVE mg/dL  ? Nitrite NEGATIVE NEGATIVE  ? Leukocytes,Ua MODERATE (A) NEGATIVE  ? RBC / HPF 0-5 0 - 5 RBC/hpf  ? WBC, UA 11-20 0 - 5 WBC/hpf  ? Bacteria, UA NONE SEEN NONE SEEN  ? Squamous Epithelial / LPF 6-10 0 - 5  ? Mucus PRESENT   ?  ? ? ?Constitutional: NAD, AAOx3  ?HE/ENT: extraocular movements grossly intact, moist mucous membranes ?CV: RRR ?PULM: nl respiratory effort, CTABL     ?Abd: gravid, non-tender, non-distended, soft      ?Ext: Non-tender, Nonedematous   ?Psych: mood appropriate, speech normal ?Pelvic: 3/50/-2 ? ?Fetal  monitoring: Cat 1 Appropriate for GA ?Baseline: 115 ?Variability: moderate ?Accelerations:  present x >2 ?Decelerations absent ? ?A/P: 26 y.o. [redacted]w[redacted]d here for antenatal surveillance for false labor ? ?Principle Diagnosis:  Normal pregnancy in third trimester ? ?Labor: not present. Monitored her for 5hrs and she had no cervical change during that time. Contractions spaced out and became less intense and she is sleeping between contractions. Reviewed labor precautions and when to return, advise that L&D is open 24/7 so if she believes she is in labor she can return at any time. She is agreeable with discharge at this time. ?Fetal Wellbeing: Reassuring Cat 1 tracing. ?Reactive NST  ?D/c home stable, precautions reviewed, follow-up as scheduled.  ? ? ?Janyce Llanos, CNM ?06/03/2021 ?6:30 AM ? ?

## 2021-06-04 ENCOUNTER — Other Ambulatory Visit: Payer: Self-pay

## 2021-06-04 ENCOUNTER — Encounter: Payer: Self-pay | Admitting: Obstetrics and Gynecology

## 2021-06-04 ENCOUNTER — Inpatient Hospital Stay: Payer: 59 | Admitting: Anesthesiology

## 2021-06-04 ENCOUNTER — Inpatient Hospital Stay
Admission: EM | Admit: 2021-06-04 | Discharge: 2021-06-06 | DRG: 806 | Disposition: A | Discharge status: Home / Self Care | Payer: 59 | Attending: Obstetrics | Admitting: Obstetrics

## 2021-06-04 DIAGNOSIS — O9081 Anemia of the puerperium: Secondary | ICD-10-CM | POA: Diagnosis not present

## 2021-06-04 DIAGNOSIS — O480 Post-term pregnancy: Secondary | ICD-10-CM | POA: Diagnosis not present

## 2021-06-04 DIAGNOSIS — O26893 Other specified pregnancy related conditions, third trimester: Secondary | ICD-10-CM | POA: Diagnosis not present

## 2021-06-04 DIAGNOSIS — Z3A40 40 weeks gestation of pregnancy: Secondary | ICD-10-CM

## 2021-06-04 DIAGNOSIS — D62 Acute posthemorrhagic anemia: Secondary | ICD-10-CM | POA: Diagnosis not present

## 2021-06-04 DIAGNOSIS — Z87891 Personal history of nicotine dependence: Secondary | ICD-10-CM | POA: Diagnosis not present

## 2021-06-04 DIAGNOSIS — O99214 Obesity complicating childbirth: Secondary | ICD-10-CM | POA: Diagnosis not present

## 2021-06-04 DIAGNOSIS — Z3490 Encounter for supervision of normal pregnancy, unspecified, unspecified trimester: Principal | ICD-10-CM | POA: Diagnosis present

## 2021-06-04 LAB — CBC
HCT: 34.3 % — ABNORMAL LOW (ref 36.0–46.0)
Hemoglobin: 11.3 g/dL — ABNORMAL LOW (ref 12.0–15.0)
MCH: 26.8 pg (ref 26.0–34.0)
MCHC: 32.9 g/dL (ref 30.0–36.0)
MCV: 81.5 fL (ref 80.0–100.0)
Platelets: 253 10*3/uL (ref 150–400)
RBC: 4.21 MIL/uL (ref 3.87–5.11)
RDW: 14.3 % (ref 11.5–15.5)
WBC: 10.7 10*3/uL — ABNORMAL HIGH (ref 4.0–10.5)
nRBC: 0 % (ref 0.0–0.2)

## 2021-06-04 LAB — COMPREHENSIVE METABOLIC PANEL
ALT: 20 U/L (ref 0–44)
AST: 22 U/L (ref 15–41)
Albumin: 2.5 g/dL — ABNORMAL LOW (ref 3.5–5.0)
Alkaline Phosphatase: 159 U/L — ABNORMAL HIGH (ref 38–126)
Anion gap: 7 (ref 5–15)
BUN: 9 mg/dL (ref 6–20)
CO2: 22 mmol/L (ref 22–32)
Calcium: 8.9 mg/dL (ref 8.9–10.3)
Chloride: 103 mmol/L (ref 98–111)
Creatinine, Ser: 0.48 mg/dL (ref 0.44–1.00)
GFR, Estimated: >60 mL/min (ref >60)
Glucose, Bld: 111 mg/dL — ABNORMAL HIGH (ref 70–99)
Potassium: 3.9 mmol/L (ref 3.5–5.1)
Sodium: 132 mmol/L — ABNORMAL LOW (ref 135–145)
Total Bilirubin: 0.5 mg/dL (ref 0.3–1.2)
Total Protein: 7.1 g/dL (ref 6.5–8.1)

## 2021-06-04 LAB — PROTEIN / CREATININE RATIO, URINE
Creatinine, Urine: 196 mg/dL
Protein Creatinine Ratio: 0.08 mg/mg{Cre} (ref 0.00–0.15)
Total Protein, Urine: 16 mg/dL

## 2021-06-04 LAB — TYPE AND SCREEN
ABO/RH(D): A POS
Antibody Screen: NEGATIVE

## 2021-06-04 MED ORDER — BUPIVACAINE HCL (PF) 0.25 % IJ SOLN
INTRAMUSCULAR | Status: DC | PRN
  Administered 2021-06-04 (×2): 3 mL via EPIDURAL

## 2021-06-04 MED ORDER — ACETAMINOPHEN 325 MG PO TABS
650.0000 mg | ORAL_TABLET | ORAL | Status: DC | PRN
Start: 2021-06-04 — End: 2021-06-05
  Administered 2021-06-04 (×2): 650 mg via ORAL
  Filled 2021-06-04: qty 2

## 2021-06-04 MED ORDER — AMMONIA AROMATIC IN INHA
RESPIRATORY_TRACT | Status: AC
  Filled 2021-06-04: qty 10

## 2021-06-04 MED ORDER — LACTATED RINGERS IV SOLN: INTRAVENOUS | Status: DC

## 2021-06-04 MED ORDER — MISOPROSTOL 25 MCG QUARTER TABLET
25.0000 ug | ORAL_TABLET | ORAL | Status: DC | PRN
Start: 2021-06-04 — End: 2021-06-05

## 2021-06-04 MED ORDER — PHENYLEPHRINE 40 MCG/ML (10ML) SYRINGE FOR IV PUSH (FOR BLOOD PRESSURE SUPPORT)
80.0000 ug | PREFILLED_SYRINGE | INTRAVENOUS | Status: DC | PRN
Start: 2021-06-04 — End: 2021-06-05

## 2021-06-04 MED ORDER — OXYTOCIN-SODIUM CHLORIDE 30-0.9 UT/500ML-% IV SOLN
1.0000 m[IU]/min | INTRAVENOUS | Status: DC
  Administered 2021-06-04: 2 m[IU]/min via INTRAVENOUS
  Filled 2021-06-04 (×2): qty 500

## 2021-06-04 MED ORDER — LIDOCAINE HCL (PF) 1 % IJ SOLN
INTRAMUSCULAR | Status: DC
Start: 2021-06-04 — End: 2021-06-05
  Filled 2021-06-04: qty 30

## 2021-06-04 MED ORDER — OXYTOCIN 10 UNIT/ML IJ SOLN
INTRAMUSCULAR | Status: AC
  Filled 2021-06-04: qty 2

## 2021-06-04 MED ORDER — LIDOCAINE HCL (PF) 1 % IJ SOLN
INTRAMUSCULAR | Status: DC | PRN
Start: 2021-06-04 — End: 2021-06-04
  Administered 2021-06-04: 1 mL
  Administered 2021-06-04: 3 mL

## 2021-06-04 MED ORDER — EPHEDRINE 5 MG/ML INJ: 10.0000 mg | INTRAVENOUS | Status: DC | PRN

## 2021-06-04 MED ORDER — DIPHENHYDRAMINE HCL 50 MG/ML IJ SOLN: 12.5000 mg | INTRAMUSCULAR | Status: DC | PRN

## 2021-06-04 MED ORDER — TERBUTALINE SULFATE 1 MG/ML IJ SOLN
0.2500 mg | Freq: Once | INTRAMUSCULAR | Status: DC | PRN
Start: 2021-06-04 — End: 2021-06-05

## 2021-06-04 MED ORDER — LIDOCAINE HCL (PF) 1 % IJ SOLN
INTRAMUSCULAR | Status: AC
  Filled 2021-06-04: qty 30

## 2021-06-04 MED ORDER — LACTATED RINGERS IV SOLN
500.0000 mL | Freq: Once | INTRAVENOUS | Status: AC
  Administered 2021-06-04: 500 mL via INTRAVENOUS

## 2021-06-04 MED ORDER — MISOPROSTOL 200 MCG PO TABS
ORAL_TABLET | ORAL | Status: AC
  Filled 2021-06-04: qty 4

## 2021-06-04 MED ORDER — FENTANYL-BUPIVACAINE-NACL 0.5-0.125-0.9 MG/250ML-% EP SOLN
12.0000 mL/h | EPIDURAL | Status: DC | PRN
  Administered 2021-06-04: 12 mL/h via EPIDURAL

## 2021-06-04 MED ORDER — LACTATED RINGERS IV SOLN
500.0000 mL | INTRAVENOUS | Status: DC | PRN
  Administered 2021-06-04: 250 mL via INTRAVENOUS

## 2021-06-04 MED ORDER — PHENYLEPHRINE 40 MCG/ML (10ML) SYRINGE FOR IV PUSH (FOR BLOOD PRESSURE SUPPORT): 80.0000 ug | PREFILLED_SYRINGE | INTRAVENOUS | Status: DC | PRN

## 2021-06-04 MED ORDER — FENTANYL CITRATE (PF) 100 MCG/2ML IJ SOLN
50.0000 ug | INTRAMUSCULAR | Status: DC | PRN
  Administered 2021-06-04 (×2): 100 ug via INTRAVENOUS
  Filled 2021-06-04 (×2): qty 2

## 2021-06-04 MED ORDER — SOD CITRATE-CITRIC ACID 500-334 MG/5ML PO SOLN: 30.0000 mL | ORAL | Status: DC | PRN

## 2021-06-04 MED ORDER — ACETAMINOPHEN 325 MG PO TABS
ORAL_TABLET | ORAL | Status: AC
  Filled 2021-06-04: qty 2

## 2021-06-04 MED ORDER — OXYTOCIN-SODIUM CHLORIDE 30-0.9 UT/500ML-% IV SOLN: 2.5000 [IU]/h | INTRAVENOUS | Status: DC

## 2021-06-04 MED ORDER — LIDOCAINE HCL (PF) 1 % IJ SOLN: 30.0000 mL | INTRAMUSCULAR | Status: DC | PRN

## 2021-06-04 MED ORDER — ONDANSETRON HCL 4 MG/2ML IJ SOLN: 4.0000 mg | Freq: Four times a day (QID) | INTRAMUSCULAR | Status: DC | PRN

## 2021-06-04 MED ORDER — LIDOCAINE-EPINEPHRINE (PF) 1.5 %-1:200000 IJ SOLN
INTRAMUSCULAR | Status: DC | PRN
  Administered 2021-06-04: 3 mL via PERINEURAL

## 2021-06-04 MED ORDER — OXYTOCIN BOLUS FROM INFUSION
333.0000 mL | Freq: Once | INTRAVENOUS | Status: AC
  Administered 2021-06-05: 333 mL via INTRAVENOUS

## 2021-06-04 MED ORDER — FENTANYL-BUPIVACAINE-NACL 0.5-0.125-0.9 MG/250ML-% EP SOLN
EPIDURAL | Status: AC
  Filled 2021-06-04: qty 250

## 2021-06-04 NOTE — H&P (Signed)
OB History & Physical  ? ?History of Present Illness:  ?Chief Complaint:  ? ?HPI:  ?Bethany Velez is a 26 y.o. G2P1001 female at [redacted]w[redacted]d dated by u/s.  She presents to L&D for uterine contractions. ? ?She reports:  ?-active fetal movement ?-no leakage of fluid ?-no vaginal bleeding ?-onset of contractions today currently every 5 minutes ? ?Pregnancy Issues: ?BMI 40 ?Anemia ?Heart murmur ?Chlamydia (10/2020) ?Chiari Malformation, type 1 ?HSV - oral only ?Fatty liver disease ?GERD ?Hidradenitis suppurativa ?Alcohol abuse (2015) ? ? ?Maternal Medical History:  ? ?Past Medical History:  ?Diagnosis Date  ? Anemia   ? Heart murmur 26 years old  ? ? ?Past Surgical History:  ?Procedure Laterality Date  ? NO PAST SURGERIES    ? ? ?No Known Allergies ? ?Prior to Admission medications   ?Medication Sig Start Date End Date Taking? Authorizing Provider  ?ferrous fumarate (HEMOCYTE - 106 MG FE) 325 (106 FE) MG TABS tablet Take 1 tablet by mouth.   Yes [provider]  ?ondansetron (ZOFRAN ODT) 4 MG disintegrating tablet Take 1 tablet (4 mg total) by mouth every 8 (eight) hours as needed for nausea or vomiting. 03/24/15  Yes Rebecka Apley, MD  ?Prenatal Vit-Fe Fumarate-FA (PRENATAL MULTIVITAMIN) TABS tablet Take 1 tablet by mouth daily at 12 noon.   Yes [provider]  ?levonorgestrel (MIRENA) 20 MCG/24HR IUD 1 each by Intrauterine route once. ?Patient not taking: Reported on 06/04/2021 01/29/15   Larene Pickett, FNP  ?metoCLOPramide (REGLAN) 10 MG tablet Take 1 tablet (10 mg total) by mouth every 8 (eight) hours as needed for up to 5 days for nausea (headache). 02/15/21 02/20/21  Dionne Bucy, MD  ?sucralfate (CARAFATE) 1 g tablet Take 1 tablet (1 g total) by mouth 2 (two) times daily. ?Patient not taking: Reported on 11/30/2018 03/24/15   Rebecka Apley, MD  ?SUMAtriptan (IMITREX) 50 MG tablet Take 1 tablet (50 mg total) by mouth once as needed for migraine. May repeat in 2 hours if headache persists  or recurs. 04/01/17 04/02/18  Merrily Brittle, MD  ? ? ? ?Prenatal care site: [facility] ? ?Social History: She  reports that she has quit smoking. She has never used smokeless tobacco. She reports that she does not currently use alcohol. She reports that she does not use drugs. ? ?Family History: family history is not on file.  ? ?Review of Systems: A full review of systems was performed and negative except as noted in the HPI.   ? ?Physical Exam:  ?Vital Signs: BP (!) 104/59   Pulse 97   Temp 98.2 ?F (36.8 ?C) (Axillary)   Resp 20   Ht 5\' 2"  (1.575 m)   Wt 111.6 kg   SpO2 100%   BMI 44.99 kg/m?  ? ?General:   alert and cooperative  ?Skin:  normal  ?Neurologic:    Alert & oriented x 3  ?Lungs:    Nl effort  ?Heart:   regular rate and rhythm  ?Abdomen:   Gravid, non tender between UCs  ?Extremities: : non-tender, symmetric, no edema bilaterally.     ? ? ?Results for orders placed or performed during the hospital encounter of 06/04/21 (from the past 24 hour(s))  ?CBC     Status: Abnormal  ? Collection Time: 06/04/21 12:07 PM  ?Result Value Ref Range  ? WBC 10.7 (H) 4.0 - 10.5 K/uL  ? RBC 4.21 3.87 - 5.11 MIL/uL  ? Hemoglobin 11.3 (L) 12.0 - 15.0 g/dL  ?  HCT 34.3 (L) 36.0 - 46.0 %  ? MCV 81.5 80.0 - 100.0 fL  ? MCH 26.8 26.0 - 34.0 pg  ? MCHC 32.9 30.0 - 36.0 g/dL  ? RDW 14.3 11.5 - 15.5 %  ? Platelets 253 150 - 400 K/uL  ? nRBC 0.0 0.0 - 0.2 %  ?Type and screen     Status: None  ? Collection Time: 06/04/21 12:07 PM  ?Result Value Ref Range  ? ABO/RH(D) A POS   ? Antibody Screen NEG   ? Sample Expiration    ?  06/07/2021,2359 ?Performed at [facility], [address] [place], [place] [address] ?  ?Protein / creatinine ratio, urine     Status: None  ? Collection Time: 06/04/21 12:07 PM  ?Result Value Ref Range  ? Creatinine, Urine 196 mg/dL  ? Total Protein, Urine 16 mg/dL  ? Protein Creatinine Ratio 0.08 0.00 - 0.15 mg/mg[Cre]  ?Comprehensive metabolic panel     Status: Abnormal  ? Collection Time:  06/04/21 12:07 PM  ?Result Value Ref Range  ? Sodium 132 (L) 135 - 145 mmol/L  ? Potassium 3.9 3.5 - 5.1 mmol/L  ? Chloride 103 98 - 111 mmol/L  ? CO2 22 22 - 32 mmol/L  ? Glucose, Bld 111 (H) 70 - 99 mg/dL  ? BUN 9 6 - 20 mg/dL  ? Creatinine, Ser 0.48 0.44 - 1.00 mg/dL  ? Calcium 8.9 8.9 - 10.3 mg/dL  ? Total Protein 7.1 6.5 - 8.1 g/dL  ? Albumin 2.5 (L) 3.5 - 5.0 g/dL  ? AST 22 15 - 41 U/L  ? ALT 20 0 - 44 U/L  ? Alkaline Phosphatase 159 (H) 38 - 126 U/L  ? Total Bilirubin 0.5 0.3 - 1.2 mg/dL  ? GFR, Estimated >60 >60 mL/min  ? Anion gap 7 5 - 15  ? ? ?Pertinent Results:  ?Prenatal Labs: ?Blood type/Rh A pos  ?Antibody screen neg  ?Rubella Immune  ?Varicella Immune  ?RPR NR  ?HBsAg Neg  ?HIV NR  ?GC neg  ?Chlamydia neg  ?Genetic screening none  ?1 hour GTT 79  ?3 hour GTT N/a  ?GBS neg  ? ?FHT: FHR: 125 bpm, variability: moderate,  accelerations:  Present,  decelerations:  Absent ?Category/reactivity:  Category I ?TOCO: regular, every 2-4 minutes ?SVE: 3.5 / 80 / -3 ?  ? ?Assessment:  ?Bethany Velez is a 26 y.o. G2P1001 female at [redacted]w[redacted]d with active labor.  ? ?Plan:  ?1. Admit to Labor & Delivery; consents reviewed and obtained ? ?2. Fetal Well being  ?- Fetal Tracing: Cat I ?- GBS neg ?- Presentation: VTX confirmed by SVE  ? ?3. Routine OB: ?- Prenatal labs reviewed, as above ?- Rh pos ?- CBC & T&S on admit ?- Clear fluids, IVF ? ?4. Monitoring of Labor ?-  Contractions by external toco in place ?-  Pelvis proven to 3300g ?-  Plan for continuous fetal monitoring  ?-  Maternal pain control as desired: IVPM, nitrous, regional anesthesia ?- Anticipate vaginal delivery ? ?5. Post Partum Planning: ?- Infant feeding: Breast and bottle ?- Contraception: Paragard ?- Tdap: 03/28/21 ?- Flu: declined ? ?Haroldine Laws, CNM ?06/04/2021 6:02 PM ? ?  ?

## 2021-06-04 NOTE — Anesthesia Procedure Notes (Signed)
Epidural ?Patient location during procedure: OB ? ?Staffing ?Anesthesiologist: Piscitello, Precious Haws, MD ?Resident/CRNA: Tollie Eth, CRNA ?Performed: resident/CRNA  ? ?Preanesthetic Checklist ?Completed: patient identified, IV checked, site marked, risks and benefits discussed, surgical consent, monitors and equipment checked, pre-op evaluation and timeout performed ? ?Epidural ?Patient position: sitting ?Prep: ChloraPrep ?Patient monitoring: heart rate, continuous pulse ox and blood pressure ?Approach: midline ?Location: L3-L4 ?Injection technique: LOR saline ? ?Needle:  ?Needle type: Tuohy  ?Needle gauge: 17 G ?Needle length: 9 cm and 9 ?Needle insertion depth: 7 cm ?Catheter type: closed end flexible ?Catheter size: 19 Gauge ?Catheter at skin depth: 12 cm ?Test dose: negative and 1.5% lidocaine with Epi 1:200 K ? ?Assessment ?Sensory level: T10 ?Events: blood not aspirated, injection not painful, no injection resistance, no paresthesia and negative IV test ? ?Additional Notes ?1 attempt ?Pt. Evaluated and documentation done after procedure finished. ?Patient identified. Risks/Benefits/Options discussed with patient including but not limited to bleeding, infection, nerve damage, paralysis, failed block, incomplete pain control, headache, blood pressure changes, nausea, vomiting, reactions to medication both or allergic, itching and postpartum back pain. Confirmed with bedside nurse the patient's most recent platelet count. Confirmed with patient that they are not currently taking any anticoagulation, have any bleeding history or any family history of bleeding disorders. Patient expressed understanding and wished to proceed. All questions were answered. Sterile technique was used throughout the entire procedure. Please see nursing notes for vital signs. Test dose was given through epidural catheter and negative prior to continuing to dose epidural or start infusion. Warning signs of high block given to the  patient including shortness of breath, tingling/numbness in hands, complete motor block, or any concerning symptoms with instructions to call for help. Patient was given instructions on fall risk and not to get out of bed. All questions and concerns addressed with instructions to call with any issues or inadequate analgesia.   ? ?Patient tolerated the insertion well without immediate complications.Reason for block:procedure for pain ? ? ? ?

## 2021-06-04 NOTE — Progress Notes (Signed)
Labor Progress Note ? ?Bethany Velez is a 26 y.o. G2P1001 at [redacted]w[redacted]d by ultrasound admitted for active labor ? ?Subjective: Pt is comfortable with epidural now ? ?Objective: ?BP 107/64   Pulse 98   Temp 97.9 ?F (36.6 ?C) (Oral)   Resp 20   Ht 5\' 2"  (1.575 m)   Wt 111.6 kg   SpO2 100%   BMI 44.99 kg/m?  ? ?Fetal Assessment: ?FHT:  FHR: 110 bpm, variability: moderate,  accelerations:  Present,  decelerations:  Absent ?Category/reactivity:  Category I ?UC:   regular, every 2-3 minutes ?SVE:    ?Dilation: 6cm  ?Effacement: 90%  ?Station:  -2  ?Consistency: soft  ?Position: anterior  ?Membrane status: AROM'd at 1820 ?Amniotic color: Clear to very light ? ? ?Assessment / Plan: ?Spontaneous labor, progressing normally ? ?Labor: Progressing normally ?Preeclampsia:   132/69 ?Fetal Wellbeing:  Category I ?Pain Control:  Epidural ?I/D:   Afebril, GBS pos, AROM ?Anticipated MOD:  NSVD ? ?Clydene Laming, CNM ?06/04/2021, 6:22 PM ? ? ? ? ? ? ?  ?

## 2021-06-04 NOTE — OB Triage Note (Signed)
Pt is a G2P1 at 40w today that presents from ED with c/o Ctx for the past few days that are stronger today and coming every 5 minutes. Pt states shes had decresed FM and denies VB, LOF. She states she has had some bloody discharge. EFM applied and initial fht 140.  ?

## 2021-06-04 NOTE — H&P (Signed)
Bethany Velez is a 26 y.o. female. She is at [redacted]w[redacted]d gestation. No LMP recorded. Patient is pregnant. ?Estimated Date of Delivery: 06/04/21 ?  ?[facility]  ?  ?Current pregnancy complicated by:  ?BMI 40 ?Anemia ?Heart murmur ?Chlamydia (10/2020) ?Chiari Malformation, type 1 ?HSV ?Fatty liver disease ?GERD ?Hidradenitis suppurativa ?Alcohol abuse (2015) ?  ?Chief complaint: uterine contractions ?  ?She reports uterine contractions that started yesterday and persisted throughout the day and evening. She rates them 8/10 and states they start in the front and wrap around to the back. She reports good fetal movement.  ?  ?S: Resting comfortably. no VB.no LOF,  Active fetal movement. Denies: HA, visual changes, SOB, or RUQ/epigastric pain ?  ?Maternal Medical History:  ?  ?    ?Past Medical History:  ?Diagnosis Date  ? Anemia    ? Heart murmur 26 years old  ?  ?  ?     ?Past Surgical History:  ?Procedure Laterality Date  ? NO PAST SURGERIES      ?  ?  ?No Known Allergies ?  ?       ?Prior to Admission medications   ?Medication Sig Start Date End Date Taking? Authorizing Provider  ?ferrous fumarate (HEMOCYTE - 106 MG FE) 325 (106 FE) MG TABS tablet Take 1 tablet by mouth.       [provider]  ?levonorgestrel (MIRENA) 20 MCG/24HR IUD 1 each by Intrauterine route once. 01/29/15     Larene Pickett, FNP  ?metoCLOPramide (REGLAN) 10 MG tablet Take 1 tablet (10 mg total) by mouth every 8 (eight) hours as needed for up to 5 days for nausea (headache). 02/15/21 02/20/21   Dionne Bucy, MD  ?ondansetron (ZOFRAN ODT) 4 MG disintegrating tablet Take 1 tablet (4 mg total) by mouth every 8 (eight) hours as needed for nausea or vomiting. 03/24/15     Rebecka Apley, MD  ?Prenatal Vit-Fe Fumarate-FA (PRENATAL MULTIVITAMIN) TABS tablet Take 1 tablet by mouth daily at 12 noon.       [provider]  ?sucralfate (CARAFATE) 1 g tablet Take 1 tablet (1 g total) by mouth 2 (two) times  daily. ?Patient not taking: Reported on 11/30/2018 03/24/15     Rebecka Apley, MD  ?SUMAtriptan (IMITREX) 50 MG tablet Take 1 tablet (50 mg total) by mouth once as needed for migraine. May repeat in 2 hours if headache persists or recurs. 04/01/17 04/02/18   Merrily Brittle, MD  ?  ?  ?  ?  ?Social History: She  reports that she has quit smoking. She has never used smokeless tobacco. She reports that she does not currently use alcohol. She reports that she does not use drugs. ?  ?Family History: family history is not on file.  no history of gyn cancers ?  ?Review of Systems: A full review of systems was performed and negative except as noted in the HPI.   ?  ?  ?O: ? BP 120/73 (BP Location: Right Arm)   Pulse 90   Temp 98.3 ?F (36.8 ?C) (Oral)   Resp 18   Ht 5\' 2"  (1.575 m)   Wt 111.6 kg   BMI 44.99 kg/m?  ?     ?Results for orders placed or performed during the hospital encounter of 06/03/21 (from the past 48 hour(s))  ?Urinalysis, Routine w reflex microscopic Urine, Clean Catch  ?  Collection Time: 06/03/21  1:45 AM  ?Result Value Ref Range  ?  Color, Urine YELLOW (A) YELLOW  ?  APPearance HAZY (A) CLEAR  ?  Specific Gravity, Urine 1.013 1.005 - 1.030  ?  pH 7.0 5.0 - 8.0  ?  Glucose, UA NEGATIVE NEGATIVE mg/dL  ?  Hgb urine dipstick LARGE (A) NEGATIVE  ?  Bilirubin Urine NEGATIVE NEGATIVE  ?  Ketones, ur NEGATIVE NEGATIVE mg/dL  ?  Protein, ur NEGATIVE NEGATIVE mg/dL  ?  Nitrite NEGATIVE NEGATIVE  ?  Leukocytes,Ua MODERATE (A) NEGATIVE  ?  RBC / HPF 0-5 0 - 5 RBC/hpf  ?  WBC, UA 11-20 0 - 5 WBC/hpf  ?  Bacteria, UA NONE SEEN NONE SEEN  ?  Squamous Epithelial / LPF 6-10 0 - 5  ?  Mucus PRESENT    ?  ?  ?Constitutional: NAD, AAOx3  ?HE/ENT: extraocular movements grossly intact, moist mucous membranes ?CV: RRR ?PULM: nl respiratory effort, CTABL                                         ?Abd: gravid, non-tender, non-distended, soft                                                  ?Ext: Non-tender, Nonedematous                      ?Psych: mood appropriate, speech normal ?Pelvic: 3/50/-2 ?  ?Fetal  monitoring: Cat 1 Appropriate for GA ?Baseline: 115-125bpm ?Variability: moderate ?Accelerations: present x >2 ?Decelerations absent ?  ?A/P: 26 y.o. [redacted]w[redacted]d here for antenatal surveillance for false labor ?  ?Principle Diagnosis:  Normal pregnancy in third trimester ? ?Her contractions and unchanged cervical exam are not consistent with labor. She believes she is in labor and is requesting to stay and be evaluated for longer. Will stay for 2 more hours and reevaluate cervical exam and contraction pattern at that time. ?Fetal wellbeing: reassuring Cat 1 tracing ?Recheck in 2 hours. ? ?Janyce Llanos, CNM ?06/03/21 ? ?

## 2021-06-04 NOTE — Anesthesia Preprocedure Evaluation (Addendum)
Anesthesia Evaluation  ? ?Patient awake ? ? ? ?Reviewed: ?Allergy & Precautions, H&P , NPO status , Patient's Chart, lab work & pertinent test results ? ?Airway ?Mallampati: II ? ?TM Distance: >3 FB ?Neck ROM: full ? ? ? Dental ?no notable dental hx. ? ?  ?Pulmonary ?former smoker,  ?  ?Pulmonary exam normal ? ? ? ? ? ? ? Cardiovascular ?Normal cardiovascular exam+ Valvular Problems/Murmurs  ? ? ?  ?Neuro/Psych ?negative neurological ROS ? negative psych ROS  ? GI/Hepatic ?Neg liver ROS, GERD  Poorly Controlled,  ?Endo/Other  ?negative endocrine ROS ? Renal/GU ?negative Renal ROS  ?negative genitourinary ?  ?Musculoskeletal ? ? Abdominal ?  ?Peds ? Hematology ? ?(+) Blood dyscrasia, anemia ,   ?Anesthesia Other Findings ? ? Reproductive/Obstetrics ?(+) Pregnancy ? ?  ? ? ? ? ? ? ? ? ? ? ? ? ? ?  ?  ? ? ? ? ? ? ? ?Anesthesia Physical ?Anesthesia Plan ? ?ASA: 2 ? ?Anesthesia Plan: Epidural  ? ?Post-op Pain Management:   ? ?Induction:  ? ?PONV Risk Score and Plan:  ? ?Airway Management Planned:  ? ?Additional Equipment:  ? ?Intra-op Plan:  ? ?Post-operative Plan:  ? ?Informed Consent: I have reviewed the patients History and Physical, chart, labs and discussed the procedure including the risks, benefits and alternatives for the proposed anesthesia with the patient or authorized representative who has indicated his/her understanding and acceptance.  ? ? ? ? ? ?Plan Discussed with:  ? ?Anesthesia Plan Comments:   ? ? ? ? ? ? ?Anesthesia Quick Evaluation ? ?

## 2021-06-04 NOTE — Progress Notes (Signed)
Labor Progress Note ? ?Bethany Velez is a 26 y.o. G2P1001 at [redacted]w[redacted]d by ultrasound admitted for active labor ? ?Subjective: Pt just finished getting her epidural followed by a decel. FHT resolved  with position change.  Change in BP from 126/71 to 104/59 however FHT still resolved. ? ?Objective: ?BP 102/85 (BP Location: Left Arm)   Pulse 84   Temp 98.2 ?F (36.8 ?C) (Axillary)   Resp 20   Ht 5\' 2"  (1.575 m)   Wt 111.6 kg   BMI 44.99 kg/m?  ? ?Fetal Assessment: ?FHT:  FHR: 115 bpm, variability: moderate,  accelerations:  Abscent,  decelerations:  Present prolonged with resolution after position change ?Category/reactivity:  Category II ?UC:   regular, every 2-3 minutes ?SVE:    ?Dilation: 6cm  ?Effacement: 90%  ?Station:  -2  ?Consistency: soft  ?Position: anterior  ?Membrane status: Intact ?Amniotic color: Clear ? ?Labs: ?Lab Results  ?Component Value Date  ? WBC 10.7 (H) 06/04/2021  ? HGB 11.3 (L) 06/04/2021  ? HCT 34.3 (L) 06/04/2021  ? MCV 81.5 06/04/2021  ? PLT 253 06/04/2021  ? ? ?Assessment / Plan: ?Spontaneous labor, progressing normally ? ?Labor: Progressing normally, planning AROM after pt is fully comfortable with epidural ?Preeclampsia:   126/71 ?Fetal Wellbeing:  Category II ?Pain Control:  Epidural ?I/D:   Afebril, GBS pos, Intact ?Anticipated MOD:  NSVD ? ?06/06/2021, CNM ?06/04/2021, 5:54 PM ? ? ? ? ? ? ?  ?

## 2021-06-05 LAB — CBC
HCT: 29.1 % — ABNORMAL LOW (ref 36.0–46.0)
Hemoglobin: 9.4 g/dL — ABNORMAL LOW (ref 12.0–15.0)
MCH: 26.3 pg (ref 26.0–34.0)
MCHC: 32.3 g/dL (ref 30.0–36.0)
MCV: 81.5 fL (ref 80.0–100.0)
Platelets: 217 10*3/uL (ref 150–400)
RBC: 3.57 MIL/uL — ABNORMAL LOW (ref 3.87–5.11)
RDW: 14.1 % (ref 11.5–15.5)
WBC: 14.9 10*3/uL — ABNORMAL HIGH (ref 4.0–10.5)
nRBC: 0 % (ref 0.0–0.2)

## 2021-06-05 LAB — RPR: RPR Ser Ql: NONREACTIVE

## 2021-06-05 MED ORDER — SIMETHICONE 80 MG PO CHEW: 80.0000 mg | CHEWABLE_TABLET | ORAL | Status: DC | PRN

## 2021-06-05 MED ORDER — ONDANSETRON HCL 4 MG/2ML IJ SOLN
4.0000 mg | INTRAMUSCULAR | Status: DC | PRN
Start: 2021-06-05 — End: 2021-06-06

## 2021-06-05 MED ORDER — OXYCODONE HCL 5 MG PO TABS: 10.0000 mg | ORAL_TABLET | ORAL | Status: DC | PRN

## 2021-06-05 MED ORDER — DOCUSATE SODIUM 100 MG PO CAPS
100.0000 mg | ORAL_CAPSULE | Freq: Two times a day (BID) | ORAL | Status: DC
  Administered 2021-06-06: 100 mg via ORAL
  Filled 2021-06-05: qty 1

## 2021-06-05 MED ORDER — PRENATAL MULTIVITAMIN CH
1.0000 | ORAL_TABLET | Freq: Every day | ORAL | Status: DC
  Administered 2021-06-05 – 2021-06-06 (×2): 1 via ORAL
  Filled 2021-06-05 (×2): qty 1

## 2021-06-05 MED ORDER — ACETAMINOPHEN 325 MG PO TABS
650.0000 mg | ORAL_TABLET | ORAL | Status: DC | PRN
  Administered 2021-06-05 – 2021-06-06 (×3): 650 mg via ORAL
  Filled 2021-06-05 (×3): qty 2

## 2021-06-05 MED ORDER — IBUPROFEN 600 MG PO TABS
600.0000 mg | ORAL_TABLET | Freq: Four times a day (QID) | ORAL | Status: DC
  Administered 2021-06-05 – 2021-06-06 (×6): 600 mg via ORAL
  Filled 2021-06-05 (×6): qty 1

## 2021-06-05 MED ORDER — DIBUCAINE (PERIANAL) 1 % EX OINT
1.0000 | TOPICAL_OINTMENT | CUTANEOUS | Status: DC | PRN
Start: 2021-06-05 — End: 2021-06-06

## 2021-06-05 MED ORDER — DIPHENHYDRAMINE HCL 25 MG PO CAPS: 25.0000 mg | ORAL_CAPSULE | Freq: Four times a day (QID) | ORAL | Status: DC | PRN

## 2021-06-05 MED ORDER — COCONUT OIL OIL
1.0000 "application " | TOPICAL_OIL | Status: DC | PRN
  Filled 2021-06-05: qty 120

## 2021-06-05 MED ORDER — OXYCODONE HCL 5 MG PO TABS
5.0000 mg | ORAL_TABLET | ORAL | Status: DC | PRN
  Administered 2021-06-05: 5 mg via ORAL
  Filled 2021-06-05: qty 1

## 2021-06-05 MED ORDER — WITCH HAZEL-GLYCERIN EX PADS
1.0000 | MEDICATED_PAD | CUTANEOUS | Status: DC | PRN
Start: 2021-06-05 — End: 2021-06-06

## 2021-06-05 MED ORDER — FERROUS SULFATE 325 (65 FE) MG PO TABS
325.0000 mg | ORAL_TABLET | Freq: Two times a day (BID) | ORAL | Status: DC
  Administered 2021-06-05 – 2021-06-06 (×3): 325 mg via ORAL
  Filled 2021-06-05 (×3): qty 1

## 2021-06-05 MED ORDER — TETANUS-DIPHTH-ACELL PERTUSSIS 5-2.5-18.5 LF-MCG/0.5 IM SUSY
0.5000 mL | PREFILLED_SYRINGE | Freq: Once | INTRAMUSCULAR | Status: DC
  Filled 2021-06-05: qty 0.5

## 2021-06-05 MED ORDER — ONDANSETRON HCL 4 MG PO TABS: 4.0000 mg | ORAL_TABLET | ORAL | Status: DC | PRN

## 2021-06-05 MED ORDER — BENZOCAINE-MENTHOL 20-0.5 % EX AERO
1.0000 | INHALATION_SPRAY | CUTANEOUS | Status: DC | PRN
Start: 2021-06-05 — End: 2021-06-06
  Administered 2021-06-05: 1 via TOPICAL
  Filled 2021-06-05: qty 56

## 2021-06-05 NOTE — Progress Notes (Signed)
Postpartum Day  1 ? ?Subjective: ?no complaints, up ad lib, voiding, tolerating PO, and + flatus ? ?Doing well, no concerns. Ambulating without difficulty, pain managed with PO meds, tolerating regular diet, and voiding without difficulty.  ? ?No fever/chills, chest pain, shortness of breath, nausea/vomiting, or leg pain. No nipple or breast pain. No headache, visual changes, or RUQ/epigastric pain. ? ?Objective: ?BP 121/77   Pulse 81   Temp 98.3 ?F (36.8 ?C) (Oral)   Resp 20   Ht 5\' 2"  (1.575 m)   Wt 111.6 kg   SpO2 97%   BMI 44.99 kg/m?  ?  ?Physical Exam:  ?General: alert, cooperative, and appears stated age ?Breasts: soft/nontender ?CV: RRR ?Pulm: nl effort, CTABL ?Abdomen: soft, non-tender, active bowel sounds ?Uterine Fundus: firm ?Perineum: minimal edema, intact ?Lochia: appropriate ?DVT Evaluation: No evidence of DVT seen on physical exam. ?Negative Homan's sign. ?No cords or calf tenderness. ?No significant calf/ankle edema. ? ?Recent Labs  ?  06/04/21 ?1207 06/05/21 ?0510  ?HGB 11.3* 9.4*  ?HCT 34.3* 29.1*  ?WBC 10.7* 14.9*  ?PLT 253 217  ? ? ?Assessment/Plan: ?26 y.o. G2P1001 postpartum day # 1 ? ?-Continue routine postpartum care ?-Lactation consult PRN for breastfeeding   ?-Discussed contraceptive options including implant, IUDs hormonal and non-hormonal, injection, pills/ring/patch, condoms, and NFP.  ?-Acute blood loss anemia - hemodynamically stable and asymptomatic; start PO ferrous sulfate BID with stool softeners  ?-Immunization status:   all immunizations up to date ? ? ?Disposition: Continue inpatient postpartum care  ? ? LOS: 1 day  ? ? ?----- ?Bethany Velez ?Certified Nurse Midwife ?[facility] ?[facility]  ?

## 2021-06-05 NOTE — Progress Notes (Signed)
Labor Progress Note ? ?Bethany Velez is a 26 y.o. G2P1001 at [redacted]w[redacted]d by ultrasound admitted for active labor ? ?Subjective: Pt is comfortable but feeling some pressure ? ?Objective: ?BP 124/85   Pulse (!) 115   Temp 98.1 ?F (36.7 ?C) (Oral)   Resp 16   Ht 5\' 2"  (1.575 m)   Wt 111.6 kg   SpO2 100%   BMI 44.99 kg/m?  ? ?Fetal Assessment: ?FHT:  FHR: 130 bpm, variability: moderate,  accelerations:  Present,  decelerations:  Absent ?Category/reactivity:  Category I ?UC:   regular, every 2-4 minutes ?SVE:    ?Dilation: 6cm  ?Effacement: 100%  ?Station:  -2  ?Consistency: soft  ?Position: anterior  ?Membrane status: AROM'd at 1820 ?Amniotic color: Clear to very light ? ? ?Assessment / Plan: ?Spontaneous labor, progressing normally ? ?Labor: Progressing normally ?Preeclampsia:   118/72 ?Fetal Wellbeing:  Category I ?Pain Control:  Epidural ?I/D:   Afebril, GBS pos, AROM x 2hr ?Anticipated MOD:  NSVD ? ?Clydene Laming, CNM ?06/05/2021, 12:20 AM ? ? ? ? ? ? ?  ?

## 2021-06-05 NOTE — Anesthesia Postprocedure Evaluation (Signed)
Anesthesia Post Note ? ?Patient: Bethany Velez ? ?Procedure(s) Performed: AN AD HOC LABOR EPIDURAL ? ?Patient location during evaluation: Mother Baby ?Anesthesia Type: Epidural ?Level of consciousness: oriented and awake and alert ?Pain management: pain level controlled ?Vital Signs Assessment: post-procedure vital signs reviewed and stable ?Respiratory status: spontaneous breathing and respiratory function stable ?Cardiovascular status: blood pressure returned to baseline and stable ?Postop Assessment: no headache, no backache, no apparent nausea or vomiting and able to ambulate ?Anesthetic complications: no ? ? ?No notable events documented. ? ? ?Last Vitals:  ?Vitals:  ? 06/05/21 0348 06/05/21 0500  ?BP: 110/74 (!) 102/54  ?Pulse: 100 89  ?Resp: 20 20  ?Temp: 37 ?C 36.9 ?C  ?SpO2: 100% 91%  ?  ?Last Pain:  ?Vitals:  ? 06/05/21 0500  ?TempSrc: Oral  ?PainSc:   ? ? ?  ?  ?  ?  ?  ?  ? ?Stormy Fabian A ? ? ? ? ?

## 2021-06-05 NOTE — Lactation Note (Signed)
This note was copied from a baby's chart. ?Lactation Consultation Note ? ?Patient Name: Bethany Velez ?Today's Date: 06/05/2021 ?  ?Age:27 hours ? ?Patient states she plans to BF and bottle feed w/ formula. Has concerns about latch discomfort. States baby just bottle fed but she will call lactation for next feeding to assist with latch.  ? ?Maternal Data ?Has patient been taught Hand Expression?: Yes ? ?Feeding ?Nipple Type: Slow - flow ?  ? ?Interventions ?Interventions: Breast feeding basics reviewed;Education ?  ? ?Consult Status ?Patient will call lactation for next feed ? ? ? ?Lawanda Cousins ?06/05/2021, 10:34 AM ? ? ? ?

## 2021-06-05 NOTE — Discharge Summary (Signed)
Obstetrical Discharge Summary ? ?Patient Name: Bethany Velez ?DOB: 05/19/1995 ?MRN: [id number] ? ?Date of Admission: 06/04/2021 ?Date of Delivery: 06/04/21 ?Delivered by: Annamary Rummage CNM ?Date of Discharge: 06/06/2021 ? ?Primary OB: Phineas Real  ?LMP:No LMP recorded. ?EDC Estimated Date of Delivery: 06/04/21 ?Gestational Age at Delivery: [redacted]w[redacted]d  ? ?Antepartum complications:  ?BMI 40 ?Anemia ?Heart murmur ?Chlamydia (10/2020) ?Chiari Malformation, type 1 ?HSV - oral only ?Fatty liver disease ?GERD ?Hidradenitis suppurativa ?Alcohol abuse (2015) ? ?Admitting Diagnosis: Active labor ? ?Secondary Diagnosis: ?Patient Active Problem List  ? Diagnosis Date Noted  ? Encounter for induction of labor 06/04/2021  ? Pregnancy 06/03/2021  ? Uterine contractions 06/03/2021  ? Amniotic fluid leaking 04/24/2021  ? Non-reactive NST (non-stress test) 04/11/2021  ? Infection of skin 07/08/2015  ? Obesity, unspecified 01/10/2015  ? Hx of cardiac murmur 10/31/2014  ? Rash and other nonspecific skin eruption 06/19/2013  ? ? ?Augmentation: AROM and Pitocin ?Complications: Shoulder dystocia 30 sec ? ?Intrapartum complications/course: see delivery notes.  ? ?Delivery Type: spontaneous vaginal delivery ?Anesthesia: epidural ?Placenta: spontaneous ?Laceration: None ?Episiotomy: none ?Newborn Data: ?Live born female  ?Birth Weight:  8#13 ?APGAR: 8, 9 ? ?Newborn Delivery   ?Time head delivered: 06/04/2021 23:53:00 ?Birth date/time: 06/04/2021 23:54:00 ?Delivery type: Vaginal, Spontaneous ?  ?  ? ?26 y.o. G2P1001 at [redacted]w[redacted]d presenting with active labor, AROM with clear fluid.  She progressed to complete and pushed over an intact perineum and delivered the fetal head, followed by the shoulders after a 30 sec dystocia. She was in control the whole time, and the baby placed on the maternal abdomen. Delayed cord clamping and the pt's mother cut the baby's cord, while the baby was skin to skin. The placenta delivered spontaneously and intact. Small infraclitoral  laceration that was hemostatic and did not require a stitch. Mom and baby tolerated the procedure well.  ? ?Postpartum Procedures: none ? ?Post partum course:  ?Patient had an uncomplicated postpartum course.  By time of discharge on PPD#2, her pain was controlled on oral pain medications; she had appropriate lochia and was ambulating, voiding without difficulty and tolerating regular diet.  She was deemed stable for discharge to home.   ? ?Discharge Physical Exam:  ?BP 122/73 (BP Location: Right Arm)   Pulse 86   Temp 98 ?F (36.7 ?C) (Oral)   Resp 18   Ht 5\' 2"  (1.575 m)   Wt 111.6 kg   SpO2 98%   Breastfeeding Unknown   BMI 44.99 kg/m?  ? ?General: alert and no distress ?Pulm: normal respiratory effort ?Lochia: appropriate ?Abdomen: soft, NT ?Uterine Fundus: firm, below umbilicus ?Extremities: No evidence of DVT seen on physical exam. No lower extremity edema. ?Edinburgh:  ? ?  06/05/2021  ? 11:35 AM  ?06/07/2021 Postnatal Depression Scale Screening Tool  ?I have been able to laugh and see the funny side of things. 0  ?I have looked forward with enjoyment to things. 0  ?I have blamed myself unnecessarily when things went wrong. 0  ?I have been anxious or worried for no good reason. 0  ?I have felt scared or panicky for no good reason. 0  ?Things have been getting on top of me. 0  ?I have been so unhappy that I have had difficulty sleeping. 0  ?I have felt sad or miserable. 0  ?I have been so unhappy that I have been crying. 0  ?The thought of harming myself has occurred to me. 0  ?Edinburgh Postnatal Depression Scale Total 0  ?  ? ?  Labs: ? ?  Latest Ref Rng & Units 06/05/2021  ?  5:10 AM 06/04/2021  ? 12:07 PM 02/14/2021  ? 11:00 PM  ?CBC  ?WBC 4.0 - 10.5 K/uL 14.9   10.7   13.1    ?Hemoglobin 12.0 - 15.0 g/dL 9.4   16.111.3   09.610.7    ?Hematocrit 36.0 - 46.0 % 29.1   34.3   32.0    ?Platelets 150 - 400 K/uL 217   253   258    ? ?A POS ?Hemoglobin  ?Date Value Ref Range Status  ?06/05/2021 9.4 (L) 12.0 - 15.0 g/dL  Final  ?04/54/098103/28/2016 19.111.7 g/dL Final  ? ?HCT  ?Date Value Ref Range Status  ?06/05/2021 29.1 (L) 36.0 - 46.0 % Final  ?06/11/2014 34 % Final  ? ? ?Disposition: stable, discharge to home ?Baby Feeding: breastmilk and formula ?Baby Disposition: home with mom ? ?Contraception: Paragard ? ?Prenatal Labs:  ?Blood type/Rh A pos  ?Antibody screen neg  ?Rubella Immune  ?Varicella Immune  ?RPR NR  ?HBsAg Neg  ?HIV NR  ?GC neg  ?Chlamydia neg  ?Genetic screening none  ?1 hour GTT 79  ?3 hour GTT N/a  ?GBS neg  ? ?Rh Immune globulin given: n/a ?Rubella vaccine given: Immune ?Varicella vaccine given: Immune ?Tdap vaccine given in AP or PP setting: 03/28/21 ?Flu vaccine given in AP or PP setting: declined ? ?Plan: ?Burna FortsKimberly Solberg was discharged to home in good condition. ? ? ?Discharge Instructions: Per After Visit Summary. ?Activity: Advance as tolerated. Pelvic rest for 6 weeks.   ?Diet: Regular ?Discharge Medications: ?Allergies as of 06/06/2021   ?No Known Allergies ?  ? ?  ?Medication List  ?  ? ?STOP taking these medications   ? ?ferrous fumarate 325 (106 Fe) MG Tabs tablet ?Commonly known as: HEMOCYTE - 106 mg FE ?  ?levonorgestrel 20 MCG/24HR IUD ?Commonly known as: MIRENA ?  ?metoCLOPramide 10 MG tablet ?Commonly known as: REGLAN ?  ?sucralfate 1 g tablet ?Commonly known as: Carafate ?  ?SUMAtriptan 50 MG tablet ?Commonly known as: Imitrex ?  ? ?  ? ?TAKE these medications   ? ?acetaminophen 325 MG tablet ?Commonly known as: Tylenol ?Take 2 tablets (650 mg total) by mouth every 4 (four) hours as needed (for pain scale < 4). ?  ?benzocaine-Menthol 20-0.5 % Aero ?Commonly known as: DERMOPLAST ?Apply 1 application. topically as needed for irritation (perineal discomfort). ?  ?coconut oil Oil ?Apply 1 application. topically as needed. ?  ?docusate sodium 100 MG capsule ?Commonly known as: COLACE ?Take 1 capsule (100 mg total) by mouth 2 (two) times daily. ?  ?ferrous sulfate 325 (65 FE) MG tablet ?Take 1 tablet (325 mg total)  by mouth 2 (two) times daily with a meal. ?  ?ibuprofen 600 MG tablet ?Commonly known as: ADVIL ?Take 1 tablet (600 mg total) by mouth every 6 (six) hours. ?  ?ondansetron 4 MG disintegrating tablet ?Commonly known as: Zofran ODT ?Take 1 tablet (4 mg total) by mouth every 8 (eight) hours as needed for nausea or vomiting. ?  ?prenatal multivitamin Tabs tablet ?Take 1 tablet by mouth daily at 12 noon. ?  ?witch hazel-glycerin pad ?Commonly known as: TUCKS ?Apply 1 application. topically as needed for hemorrhoids. ?  ? ?  ? ?Outpatient follow up:  ? Follow-up Information   ? ? Haroldine Lawsxley, Jennifer, CNM Follow up in 6 week(s).   ?Specialty: Certified Nurse Midwife ?Why: postpartum appointment ?Contact information: ?[address] ?[place] [address] ?[phone] ? ? ?  ?  ? ?  ?  ? ?  ? ? ?Signed: ?Randa Ngo, CNM ? ?06/06/2021 12:28 PM ? ?

## 2021-06-05 NOTE — Lactation Note (Signed)
This note was copied from a baby's chart. ?Lactation Consultation Note ? ?Patient Name: Bethany Velez ?Today's Date: 06/05/2021 ?Reason for consult: Follow-up assessment ?Age:27 hours ? ?Parent set up with DEBP. Educated on pump schedule, milk storage, and cleaning pump parts. Parent pumped for due to oral pain.  ? ?Plan: ?Continue to stimulate breast via BF, pumping, and/or hand expression 8 or more times in 24hrs. ? ? ?(Charted under wrong time on flow sheet) ? ?Maternal Data ?Has patient been taught Hand Expression?: Yes ? ?Feeding ?Mother's Current Feeding Choice: Breast Milk and Formula  ?  ? ?Lactation Tools Discussed/Used ?Tools: Pump;Flanges;Coconut oil ?Breast pump type: Double-Electric Breast Pump ? ?Interventions ?Interventions: Coconut oil;DEBP ? ?Discharge ?Pump: DEBP ?WIC Program: Yes ? ?Consult Status ?Consult Status: Follow-up ?Date: 06/06/21 ?Follow-up type: In-patient ? ? ? ?Lawanda Cousins ?06/05/2021, 4:53 PM ? ? ? ?

## 2021-06-06 ENCOUNTER — Encounter: Payer: Self-pay | Admitting: *Deleted

## 2021-06-06 ENCOUNTER — Ambulatory Visit: Payer: Self-pay

## 2021-06-06 MED ORDER — DOCUSATE SODIUM 100 MG PO CAPS: 100.0000 mg | ORAL_CAPSULE | Freq: Two times a day (BID) | ORAL | 0 refills | Status: AC

## 2021-06-06 MED ORDER — COCONUT OIL OIL: 1.0000 "application " | TOPICAL_OIL | 0 refills | Status: AC | PRN

## 2021-06-06 MED ORDER — BENZOCAINE-MENTHOL 20-0.5 % EX AERO: 1.0000 "application " | INHALATION_SPRAY | CUTANEOUS | Status: AC | PRN

## 2021-06-06 MED ORDER — FERROUS SULFATE 325 (65 FE) MG PO TABS: 325.0000 mg | ORAL_TABLET | Freq: Two times a day (BID) | ORAL | 3 refills | Status: AC

## 2021-06-06 MED ORDER — IBUPROFEN 600 MG PO TABS: 600.0000 mg | ORAL_TABLET | Freq: Four times a day (QID) | ORAL | 0 refills | Status: AC

## 2021-06-06 MED ORDER — WITCH HAZEL-GLYCERIN EX PADS: 1.0000 "application " | MEDICATED_PAD | CUTANEOUS | 12 refills | Status: AC | PRN

## 2021-06-06 MED ORDER — ACETAMINOPHEN 325 MG PO TABS: 650.0000 mg | ORAL_TABLET | ORAL | Status: AC | PRN

## 2021-06-06 NOTE — Progress Notes (Signed)
Patient discharged home with family.  Discharge instructions, when to follow up, and prescriptions reviewed with patient.  Patient verbalized understanding. Patient will be escorted out by auxiliary.   

## 2021-06-06 NOTE — Progress Notes (Signed)
Postpartum Day  2 ? ?Subjective: ?no complaints, up ad lib, voiding, tolerating PO, and + flatus ? ?Doing well, no concerns. Ambulating without difficulty, pain managed with PO meds, tolerating regular diet, and voiding without difficulty.  ? ?No fever/chills, chest pain, shortness of breath, nausea/vomiting, or leg pain. No nipple or breast pain. No headache, visual changes, or RUQ/epigastric pain. ? ?Objective: ?BP 122/73 (BP Location: Right Arm)   Pulse 86   Temp 98 ?F (36.7 ?C) (Oral)   Resp 18   Ht 5\' 2"  (1.575 m)   Wt 111.6 kg   SpO2 98%   BMI 44.99 kg/m?  ?  ?Physical Exam:  ?General: alert, cooperative, and appears stated age ?Breasts: soft/nontender ?CV: RRR ?Pulm: nl effort, CTABL ?Abdomen: soft, non-tender, active bowel sounds ?Uterine Fundus: firm ?Perineum: minimal edema, intact ?Lochia: appropriate ?DVT Evaluation: No evidence of DVT seen on physical exam. ?Negative Homan's sign. ?No cords or calf tenderness. ?No significant calf/ankle edema. ? ?Recent Labs  ?  06/04/21 ?1207 06/05/21 ?0510  ?HGB 11.3* 9.4*  ?HCT 34.3* 29.1*  ?WBC 10.7* 14.9*  ?PLT 253 217  ? ? ? ?Assessment/Plan: ?26 y.o. G2P1001 postpartum day # 2 ? ?-Continue routine postpartum care ?-Lactation consult PRN for breastfeeding   ?-Discussed contraceptive options- wants Paragard IUD ?-Acute blood loss anemia - hemodynamically stable and asymptomatic; continue PO ferrous sulfate BID with stool softeners  ?-Immunization status:   all immunizations up to date ? ? ?Disposition: DC home ? ? LOS: 2 days  ? ? ?30, CNM ?Certified Nurse Midwife ?[facility] ?[facility]  ?

## 2021-06-06 NOTE — Lactation Note (Signed)
This note was copied from a baby's chart. ?Lactation Consultation Note ? ?Patient Name: Bethany Velez ?Today's Date: 06/06/2021 ?Reason for consult: Follow-up assessment;Term ?Age:27 hours ?Mom and baby awaiting discharge ?Maternal Data ?Does the patient have breastfeeding experience prior to this delivery?: Yes ?How long did the patient breastfeed?: 1 yr ?Mom has a sore left nipple, some redness noted, given medela purelan with instruction and also medela gel pads x 2 with instruction in use  ?Feeding ?Mother's Current Feeding Choice: Breast Milk and Formula ?No feeding observed, but we discussed making sure baby gets a deep latch to prevent nipple tenderness from becoming worse, mom also supplementing baby small amts of formula after breastfeeding, encouraged mom to offer breast first before formula and to decrease formula as she noted that breastmilk is increasing, baby gassy and this is contributing to fussiness that mom interprets is hunger.   ?LATCH Score ?Latch:  (I did not observe a feeding) ? ?  ? ?  ? ?  ? ?  ? ?  ? ? ?Lactation Tools Discussed/Used ?  ? ?Interventions ?Interventions: Comfort gels;Education ?LC name updated on white board ?Discharge ?Discharge Education: Engorgement and breast care;Warning signs for feeding baby ?Pump: Manual ?WIC Program: Yes ? ?Consult Status ?Consult Status: Complete ?Date: 06/06/21 ?Follow-up type: In-patient ? ? ? ?Dyann Kief ?06/06/2021, 11:21 AM ? ? ? ?

## 2021-06-06 NOTE — Discharge Instructions (Signed)

## 2022-02-26 ENCOUNTER — Emergency Department
Admission: EM | Admit: 2022-02-26 | Discharge: 2022-02-26 | Disposition: A | Discharge status: Home / Self Care | Payer: Medicaid Other | Attending: Student in an Organized Health Care Education/Training Program | Admitting: Student in an Organized Health Care Education/Training Program

## 2022-02-26 ENCOUNTER — Other Ambulatory Visit: Payer: Self-pay

## 2022-02-26 ENCOUNTER — Emergency Department: Payer: Medicaid Other

## 2022-02-26 DIAGNOSIS — R300 Dysuria: Secondary | ICD-10-CM | POA: Insufficient documentation

## 2022-02-26 DIAGNOSIS — R1030 Lower abdominal pain, unspecified: Secondary | ICD-10-CM | POA: Insufficient documentation

## 2022-02-26 LAB — COMPREHENSIVE METABOLIC PANEL
ALT: 21 U/L (ref 0–44)
AST: 19 U/L (ref 15–41)
Albumin: 3.7 g/dL (ref 3.5–5.0)
Alkaline Phosphatase: 46 U/L (ref 38–126)
Anion gap: 8 (ref 5–15)
BUN: 9 mg/dL (ref 6–20)
CO2: 26 mmol/L (ref 22–32)
Calcium: 9.1 mg/dL (ref 8.9–10.3)
Chloride: 105 mmol/L (ref 98–111)
Creatinine, Ser: 0.7 mg/dL (ref 0.44–1.00)
GFR, Estimated: >60 mL/min (ref >60)
Glucose, Bld: 124 mg/dL — ABNORMAL HIGH (ref 70–99)
Potassium: 3.5 mmol/L (ref 3.5–5.1)
Sodium: 139 mmol/L (ref 135–145)
Total Bilirubin: 0.5 mg/dL (ref 0.3–1.2)
Total Protein: 7.4 g/dL (ref 6.5–8.1)

## 2022-02-26 LAB — URINALYSIS, ROUTINE W REFLEX MICROSCOPIC
Bilirubin Urine: NEGATIVE
Glucose, UA: NEGATIVE mg/dL
Hgb urine dipstick: NEGATIVE
Ketones, ur: NEGATIVE mg/dL
Nitrite: NEGATIVE
Protein, ur: NEGATIVE mg/dL
Specific Gravity, Urine: 1.009 (ref 1.005–1.030)
pH: 5 (ref 5.0–8.0)

## 2022-02-26 LAB — CBC
HCT: 33.6 % — ABNORMAL LOW (ref 36.0–46.0)
Hemoglobin: 10.7 g/dL — ABNORMAL LOW (ref 12.0–15.0)
MCH: 25.5 pg — ABNORMAL LOW (ref 26.0–34.0)
MCHC: 31.8 g/dL (ref 30.0–36.0)
MCV: 80.2 fL (ref 80.0–100.0)
Platelets: 227 10*3/uL (ref 150–400)
RBC: 4.19 MIL/uL (ref 3.87–5.11)
RDW: 14.5 % (ref 11.5–15.5)
WBC: 11.5 10*3/uL — ABNORMAL HIGH (ref 4.0–10.5)
nRBC: 0 % (ref 0.0–0.2)

## 2022-02-26 LAB — POC URINE PREG, ED: Preg Test, Ur: NEGATIVE

## 2022-02-26 LAB — LIPASE, BLOOD: Lipase: 37 U/L (ref 11–51)

## 2022-02-26 MED ORDER — IOHEXOL 350 MG/ML SOLN
100.0000 mL | Freq: Once | INTRAVENOUS | Status: AC | PRN
  Administered 2022-02-26: 100 mL via INTRAVENOUS

## 2022-02-26 MED ORDER — TRAMADOL HCL 50 MG PO TABS: 50.0000 mg | ORAL_TABLET | Freq: Four times a day (QID) | ORAL | 0 refills | Status: AC | PRN

## 2022-02-26 MED ORDER — CEPHALEXIN 500 MG PO CAPS: 500.0000 mg | ORAL_CAPSULE | Freq: Three times a day (TID) | ORAL | 0 refills | Status: AC

## 2022-02-26 NOTE — ED Provider Triage Note (Signed)
Emergency Medicine Provider Triage Evaluation Note  Bethany Velez , a 26 y.o. female  was evaluated in triage.  Pt complains of abdominal pain.  Review of Systems  Positive:  Negative:   Physical Exam  BP 137/88 (BP Location: Right Arm)   Pulse 77   Temp 98.4 F (36.9 C) (Oral)   Resp 18   Wt 104.3 kg   LMP 02/05/2022   SpO2 100%   BMI 42.07 kg/m  Gen:   Awake, no distress   Resp:  Normal effort  MSK:   Moves extremities without difficulty  Other:    Medical Decision Making  Medically screening exam initiated at 1:32 PM.  Appropriate orders placed.  Merla Sawka was informed that the remainder of the evaluation will be completed by another provider, this initial triage assessment does not replace that evaluation, and the importance of remaining in the ED until their evaluation is complete.     Faythe Ghee, PA-C 02/26/22 1335

## 2022-02-26 NOTE — Discharge Instructions (Signed)

## 2022-02-26 NOTE — ED Triage Notes (Signed)
Pt sts that is having lower abd pain around her ovaries. Pt sts that she has a IUD but the pain is very painful.

## 2022-02-26 NOTE — ED Provider Notes (Signed)
[facility] Provider Note    Event Date/Time   First MD Initiated Contact with Patient 02/26/22 1428     (approximate)   History   Abdominal Pain   HPI  Bethany Velez is a 26 y.o. female   who presents to the ER for evaluation of lower abdominal pain started on left side but now progressing to the right lower quadrant.  Denies any vaginal discharge has had some dysuria.  No measured fevers no nausea or vomiting.  States he was having severe pain earlier today that brought her to tears.      Physical Exam   Triage Vital Signs: ED Triage Vitals  Enc Vitals Group     BP 02/26/22 1331 137/88     Pulse Rate 02/26/22 1331 77     Resp 02/26/22 1331 18     Temp 02/26/22 1331 98.4 F (36.9 C)     Temp Source 02/26/22 1331 Oral     SpO2 02/26/22 1331 100 %     Weight 02/26/22 1331 230 lb (104.3 kg)     Height 02/26/22 1432 5\' 2"  (1.575 m)     Head Circumference --      Peak Flow --      Pain Score --      Pain Loc --      Pain Edu? --      Excl. in [place]? --     Most recent vital signs: Vitals:   02/26/22 1331 02/26/22 1541  BP: 137/88 130/80  Pulse: 77 80  Resp: 18 18  Temp: 98.4 F (36.9 C)   SpO2: 100% 100%     Constitutional: Alert  Eyes: Conjunctivae are normal.  Head: Atraumatic. Nose: No congestion/rhinnorhea. Mouth/Throat: Mucous membranes are moist.   Neck: Painless ROM.  Cardiovascular:   Good peripheral circulation. Respiratory: Normal respiratory effort.  No retractions.  Gastrointestinal: Soft and nontender.  Musculoskeletal:  no deformity Neurologic:  MAE spontaneously. No gross focal neurologic deficits are appreciated.  Skin:  Skin is warm, dry and intact. No rash noted. Psychiatric: Mood and affect are normal. Speech and behavior are normal.    ED Results / Procedures / Treatments   Labs (all labs ordered are listed, but only abnormal results are displayed) Labs Reviewed  COMPREHENSIVE METABOLIC PANEL -  Abnormal; Notable for the following components:      Result Value   Glucose, Bld 124 (*)    All other components within normal limits  CBC - Abnormal; Notable for the following components:   WBC 11.5 (*)    Hemoglobin 10.7 (*)    HCT 33.6 (*)    MCH 25.5 (*)    All other components within normal limits  URINALYSIS, ROUTINE W REFLEX MICROSCOPIC - Abnormal; Notable for the following components:   Color, Urine YELLOW (*)    APPearance CLOUDY (*)    Leukocytes,Ua LARGE (*)    Bacteria, UA RARE (*)    All other components within normal limits  LIPASE, BLOOD  POC URINE PREG, ED     EKG     RADIOLOGY Please see ED Course for my review and interpretation.  I personally reviewed all radiographic images ordered to evaluate for the above acute complaints and reviewed radiology reports and findings.  These findings were personally discussed with the patient.  Please see medical record for radiology report.    PROCEDURES:  Critical Care performed: No  Procedures   MEDICATIONS ORDERED IN ED: Medications  iohexol (OMNIPAQUE)  350 MG/ML injection 100 mL (100 mLs Intravenous Contrast Given 02/26/22 1518)     IMPRESSION / MDM / ASSESSMENT AND PLAN / ED COURSE  I reviewed the triage vital signs and the nursing notes.                              Differential diagnosis includes, but is not limited to, appendicitis, cystitis, spasm, stone, ovarian cyst, torsion, abscess, musculoskeletal strain, stone.  Patient presenting to the ER for evaluation of symptoms as described above.  Based on symptoms, risk factors and considered above differential, this presenting complaint could reflect a potentially life-threatening illness therefore the patient will be placed on continuous pulse oximetry and telemetry for monitoring.  Laboratory evaluation will be sent to evaluate for the above complaints.  Patient currently nontoxic-appearing but given reported pain location radiating to right lower  quadrant will order CT to further evaluate particular given her leukocytosis.  Will check urine.   Clinical Course as of 02/26/22 1601  Thu Feb 26, 2022  1529 CT Imaging on my review interpretation does not show any evidence of perforation or obstruction will await formal radiology report. [PR]  1558 CT imaging without acute abnormality.  Not consistent appendicitis.  Repeat exam is benign.  Will cover for cystitis.  Does appear stable appropriate for outpatient follow-up.  Patient agreed to plan. [PR]    Clinical Course User Index [PR] Willy Eddy, MD     FINAL CLINICAL IMPRESSION(S) / ED DIAGNOSES   Final diagnoses:  Lower abdominal pain     Rx / DC Orders   ED Discharge Orders          Ordered    cephALEXin (KEFLEX) 500 MG capsule  3 times daily        02/26/22 1557    traMADol (ULTRAM) 50 MG tablet  Every 6 hours PRN        02/26/22 1557             Note:  This document was prepared using Dragon voice recognition software and may include unintentional dictation errors.    Willy Eddy, MD 02/26/22 [phone]

## 2022-04-03 ENCOUNTER — Other Ambulatory Visit: Payer: Self-pay | Admitting: Family Medicine

## 2022-04-03 DIAGNOSIS — R102 Pelvic and perineal pain: Secondary | ICD-10-CM

## 2022-04-06 ENCOUNTER — Ambulatory Visit
Admission: RE | Admit: 2022-04-06 | Discharge: 2022-04-06 | Disposition: A | Discharge status: Home / Self Care | Payer: Medicaid Other | Source: Ambulatory Visit | Attending: Family Medicine | Admitting: Family Medicine

## 2022-04-06 DIAGNOSIS — R102 Pelvic and perineal pain: Secondary | ICD-10-CM | POA: Insufficient documentation

## 2022-04-07 ENCOUNTER — Emergency Department
Admission: EM | Admit: 2022-04-07 | Discharge: 2022-04-07 | Disposition: A | Discharge status: Home / Self Care | Payer: Medicaid Other | Attending: Emergency Medicine | Admitting: Emergency Medicine

## 2022-04-07 ENCOUNTER — Emergency Department: Payer: Medicaid Other

## 2022-04-07 ENCOUNTER — Other Ambulatory Visit: Payer: Self-pay

## 2022-04-07 DIAGNOSIS — K2900 Acute gastritis without bleeding: Secondary | ICD-10-CM

## 2022-04-07 DIAGNOSIS — R1310 Dysphagia, unspecified: Secondary | ICD-10-CM

## 2022-04-07 MED ORDER — ALUM & MAG HYDROXIDE-SIMETH 200-200-20 MG/5ML PO SUSP
30.0000 mL | Freq: Once | ORAL | Status: AC
Start: 2022-04-07 — End: 2022-04-07
  Administered 2022-04-07: 30 mL via ORAL
  Filled 2022-04-07: qty 30

## 2022-04-07 MED ORDER — LIDOCAINE VISCOUS HCL 2 % MT SOLN
15.0000 mL | Freq: Once | OROMUCOSAL | Status: AC
Start: 2022-04-07 — End: 2022-04-07
  Administered 2022-04-07: 15 mL via ORAL
  Filled 2022-04-07: qty 15

## 2022-04-07 NOTE — ED Triage Notes (Addendum)
Pt here with a food bolus in her throat. Pt states she was eating soup that had a chicken bone in it and it got lodged in her throat and states she still feels pressure. Pt talking in full sentences. Pt states she thinks the bone is now in her stomach and she is concerned. Pt was here yesterday for same and is awaiting her test results per pt.

## 2022-04-07 NOTE — ED Provider Notes (Signed)
[facility] Provider Note    Event Date/Time   First MD Initiated Contact with Patient 04/07/22 1517     (approximate)   History   Chief Complaint Food Bolus   HPI  Bethany Velez is a 27 y.o. female with past medical history of anemia who presents to the ED complaining of food bolus.  Patient reports that earlier today around lunchtime she was eating soup with beef in it.  She feels like she accidentally swallowed a beef bone fragment, describes feeling it go into the back of her throat and tried to cough it up however ended up swallowing it.  She states that it feels like it irritated her esophagus on the way down and now she has ongoing pressure in her epigastrium.  She reports feeling nauseous, but has not vomited, denies any difficulty tolerating oral liquids but has been afraid to try any solids since onset of symptoms.  She denies any difficulty breathing and has been swallowing her oral secretions without difficulty.     Physical Exam   Triage Vital Signs: ED Triage Vitals  Enc Vitals Group     BP 04/07/22 1332 129/66     Pulse Rate 04/07/22 1330 91     Resp 04/07/22 1330 18     Temp 04/07/22 1330 98.4 F (36.9 C)     Temp Source 04/07/22 1330 Oral     SpO2 04/07/22 1330 99 %     Weight 04/07/22 1331 229 lb 15 oz (104.3 kg)     Height 04/07/22 1331 5\' 2"  (1.575 m)     Head Circumference --      Peak Flow --      Pain Score 04/07/22 1331 4     Pain Loc --      Pain Edu? --      Excl. in [place]? --     Most recent vital signs: Vitals:   04/07/22 1613 04/07/22 1713  BP: 114/77 129/79  Pulse: (!) 140 88  Resp: 20 20  Temp: (!) 103 F (39.4 C) 98.9 F (37.2 C)  SpO2: 100% 99%    Constitutional: Alert and oriented. Eyes: Conjunctivae are normal. Head: Atraumatic. Nose: No congestion/rhinnorhea. Mouth/Throat: Mucous membranes are moist.  Posterior oropharynx clear without edema or foreign body. Cardiovascular: Normal rate, regular  rhythm. Grossly normal heart sounds.  2+ radial pulses bilaterally. Respiratory: Normal respiratory effort.  No retractions. Lungs CTAB. Gastrointestinal: Soft and nontender. No distention. Musculoskeletal: No lower extremity tenderness nor edema.  Neurologic:  Normal speech and language. No gross focal neurologic deficits are appreciated.    ED Results / Procedures / Treatments   Labs (all labs ordered are listed, but only abnormal results are displayed) Labs Reviewed - No data to display  RADIOLOGY Soft tissue neck x-ray reviewed and interpreted by me with no foreign body or airway narrowing.  Chest x-ray reviewed and interpreted by me with no foreign body, infiltrate, edema, or effusion.  PROCEDURES:  Critical Care performed: No  Procedures   MEDICATIONS ORDERED IN ED: Medications  alum & mag hydroxide-simeth (MAALOX/MYLANTA) 200-200-20 MG/5ML suspension 30 mL (30 mLs Oral Given 04/07/22 1747)    And  lidocaine (XYLOCAINE) 2 % viscous mouth solution 15 mL (15 mLs Oral Given 04/07/22 1747)     IMPRESSION / MDM / ASSESSMENT AND PLAN / ED COURSE  I reviewed the triage vital signs and the nursing notes.  27 y.o. female with past medical history of anemia who presents to the ED complaining of throat irritation and pressure in her epigastrium since accidentally swallowing a fragment of beef bone.  Patient's presentation is most consistent with acute complicated illness / injury requiring diagnostic workup.  Differential diagnosis includes, but is not limited to, esophageal foreign body, foreign body in the stomach, esophageal irritation, globus sensation.  Patient well-appearing and in no acute distress, vital signs are unremarkable.  She is tolerating her oral secretions without difficulty and does not appear to have any respiratory symptoms.  We will further assess with x-ray imaging of her neck and chest as animal bone should be radio  opaque.  Neck x-ray concerning for possible foreign body at the level of the thyroid cartilage, however patient has no symptoms in this area and is tolerating oral secretions without difficulty and has no breathing problems.  Her described discomfort is primarily in her epigastric area, chest x-ray shows no evidence of foreign body or other acute process.  Patient was given GI cocktail with resolution of symptoms.  Imaging reviewed with Dr. Tami Ribas of ENT, who agrees that pharyngeal foreign body is unlikely given her symptoms, does state that patient may follow-up in his office tomorrow for further evaluation.  Patient also requesting OB/GYN referral for issues related to her IUD seen on ultrasound yesterday.  She was counseled to return to the ED for new or worsening symptoms, patient agrees with plan.      FINAL CLINICAL IMPRESSION(S) / ED DIAGNOSES   Final diagnoses:  Dysphagia, unspecified type  Acute gastritis without hemorrhage, unspecified gastritis type     Rx / DC Orders   ED Discharge Orders     None        Note:  This document was prepared using Dragon voice recognition software and may include unintentional dictation errors.   Blake Divine, MD 04/07/22 [phone]

## 2022-04-07 NOTE — ED Notes (Signed)
Vital signs entered on wrong pt

## 2022-05-15 DIAGNOSIS — R1310 Dysphagia, unspecified: Principal | ICD-10-CM

## 2022-09-15 ENCOUNTER — Emergency Department
Admit: 2022-09-15 | Discharge: 2022-09-15 | Disposition: A | Discharge status: Home / Self Care | Payer: PRIVATE HEALTH INSURANCE | Attending: Family Medicine

## 2022-09-15 ENCOUNTER — Ambulatory Visit
Admit: 2022-09-15 | Discharge: 2022-09-15 | Disposition: A | Discharge status: Home / Self Care | Payer: PRIVATE HEALTH INSURANCE | Attending: Family Medicine

## 2022-09-15 DIAGNOSIS — O209 Hemorrhage in early pregnancy, unspecified: Principal | ICD-10-CM

## 2022-09-18 DIAGNOSIS — Z3491 Encounter for supervision of normal pregnancy, unspecified, first trimester: Principal | ICD-10-CM

## 2022-09-18 DIAGNOSIS — Z719 Counseling, unspecified: Principal | ICD-10-CM

## 2022-09-21 DIAGNOSIS — Z3490 Encounter for supervision of normal pregnancy, unspecified, unspecified trimester: Principal | ICD-10-CM

## 2022-09-23 ENCOUNTER — Ambulatory Visit: Admit: 2022-09-23 | Discharge: 2022-09-24 | Payer: PRIVATE HEALTH INSURANCE

## 2022-10-08 ENCOUNTER — Ambulatory Visit: Admit: 2022-10-08 | Discharge: 2022-10-09 | Payer: PRIVATE HEALTH INSURANCE

## 2022-10-08 DIAGNOSIS — G935 Compression of brain: Principal | ICD-10-CM

## 2022-10-08 DIAGNOSIS — Z3A13 13 weeks gestation of pregnancy: Principal | ICD-10-CM

## 2022-10-08 DIAGNOSIS — Z719 Counseling, unspecified: Principal | ICD-10-CM

## 2022-10-08 DIAGNOSIS — Z8759 Personal history of other complications of pregnancy, childbirth and the puerperium: Principal | ICD-10-CM

## 2022-10-08 MED ORDER — MAGNESIUM OXIDE 400 MG (241.3 MG MAGNESIUM) TABLET
ORAL_TABLET | Freq: Every day | ORAL | 2 refills | 30 days | Status: CP
Start: 2022-10-08 — End: 2023-10-08

## 2022-10-08 MED ORDER — PRENATAL VITAMIN WITH CALCIUM NO.72-IRON 27 MG-FOLIC ACID 1 MG TABLET
ORAL_TABLET | Freq: Every day | ORAL | 11 refills | 30 days | Status: CP
Start: 2022-10-08 — End: ?

## 2022-10-08 MED ORDER — ASPIRIN 81 MG TABLET,DELAYED RELEASE
ORAL_TABLET | Freq: Every day | ORAL | 2 refills | 150 days | Status: CP
Start: 2022-10-08 — End: 2023-10-08

## 2022-10-08 MED ORDER — CAFFEINE 200 MG TABLET
ORAL_TABLET | ORAL | 2 refills | 5 days | Status: CP | PRN
Start: 2022-10-08 — End: ?

## 2022-11-03 ENCOUNTER — Other Ambulatory Visit: Payer: Self-pay | Admitting: Family Medicine

## 2022-11-03 ENCOUNTER — Encounter: Payer: Self-pay | Admitting: Family Medicine

## 2022-11-03 DIAGNOSIS — G935 Compression of brain: Secondary | ICD-10-CM

## 2022-11-10 ENCOUNTER — Ambulatory Visit
Admission: RE | Admit: 2022-11-10 | Discharge: 2022-11-10 | Disposition: A | Discharge status: Home / Self Care | Payer: Medicaid Other | Source: Ambulatory Visit | Attending: Family Medicine | Admitting: Family Medicine

## 2022-11-10 ENCOUNTER — Encounter: Payer: Self-pay | Admitting: Radiology

## 2022-11-10 DIAGNOSIS — G935 Compression of brain: Secondary | ICD-10-CM | POA: Insufficient documentation

## 2022-11-12 ENCOUNTER — Ambulatory Visit: Admit: 2022-11-12 | Discharge: 2022-11-13 | Payer: PRIVATE HEALTH INSURANCE

## 2022-12-14 ENCOUNTER — Ambulatory Visit: Admit: 2022-12-14 | Discharge: 2022-12-15 | Payer: PRIVATE HEALTH INSURANCE

## 2023-01-14 ENCOUNTER — Ambulatory Visit
Admit: 2023-01-14 | Discharge: 2023-01-15 | Payer: PRIVATE HEALTH INSURANCE | Attending: Physician Assistant | Primary: Physician Assistant

## 2023-01-14 DIAGNOSIS — L732 Hidradenitis suppurativa: Principal | ICD-10-CM

## 2023-01-14 MED ORDER — CLOBETASOL 0.05 % TOPICAL OINTMENT
OPHTHALMIC | 3 refills | 0.00000 days | Status: CP
Start: 2023-01-14 — End: ?

## 2023-01-15 ENCOUNTER — Ambulatory Visit: Admit: 2023-01-15 | Discharge: 2023-01-16 | Payer: PRIVATE HEALTH INSURANCE

## 2023-02-04 ENCOUNTER — Telehealth: Admit: 2023-02-04 | Discharge: 2023-02-05 | Payer: PRIVATE HEALTH INSURANCE

## 2023-02-08 DIAGNOSIS — Z3482 Encounter for supervision of other normal pregnancy, second trimester: Principal | ICD-10-CM

## 2023-02-18 ENCOUNTER — Ambulatory Visit: Admit: 2023-02-18 | Discharge: 2023-02-19 | Payer: PRIVATE HEALTH INSURANCE

## 2023-02-18 ENCOUNTER — Telehealth: Admit: 2023-02-18 | Discharge: 2023-02-19 | Payer: PRIVATE HEALTH INSURANCE

## 2023-02-18 DIAGNOSIS — G935 Compression of brain: Principal | ICD-10-CM

## 2023-03-03 ENCOUNTER — Emergency Department
Admission: EM | Admit: 2023-03-03 | Discharge: 2023-03-03 | Disposition: A | Discharge status: Home / Self Care | Payer: Medicaid Other | Attending: Emergency Medicine | Admitting: Emergency Medicine

## 2023-03-03 ENCOUNTER — Other Ambulatory Visit: Payer: Self-pay

## 2023-03-03 DIAGNOSIS — R519 Headache, unspecified: Secondary | ICD-10-CM | POA: Diagnosis present

## 2023-03-03 DIAGNOSIS — G43809 Other migraine, not intractable, without status migrainosus: Secondary | ICD-10-CM | POA: Insufficient documentation

## 2023-03-03 LAB — COMPREHENSIVE METABOLIC PANEL
ALT: 12 U/L (ref 0–44)
AST: 17 U/L (ref 15–41)
Albumin: 2.6 g/dL — ABNORMAL LOW (ref 3.5–5.0)
Alkaline Phosphatase: 109 U/L (ref 38–126)
Anion gap: 8 (ref 5–15)
BUN: 10 mg/dL (ref 6–20)
CO2: 18 mmol/L — ABNORMAL LOW (ref 22–32)
Calcium: 8.4 mg/dL — ABNORMAL LOW (ref 8.9–10.3)
Chloride: 106 mmol/L (ref 98–111)
Creatinine, Ser: 0.52 mg/dL (ref 0.44–1.00)
GFR, Estimated: >60 mL/min (ref >60)
Glucose, Bld: 100 mg/dL — ABNORMAL HIGH (ref 70–99)
Potassium: 3.5 mmol/L (ref 3.5–5.1)
Sodium: 132 mmol/L — ABNORMAL LOW (ref 135–145)
Total Bilirubin: 0.3 mg/dL (ref <1.2)
Total Protein: 6.7 g/dL (ref 6.5–8.1)

## 2023-03-03 LAB — CBC WITH DIFFERENTIAL/PLATELET
Abs Immature Granulocytes: 0.05 10*3/uL (ref 0.00–0.07)
Basophils Absolute: 0 10*3/uL (ref 0.0–0.1)
Basophils Relative: 0 %
Eosinophils Absolute: 0.1 10*3/uL (ref 0.0–0.5)
Eosinophils Relative: 1 %
HCT: 29 % — ABNORMAL LOW (ref 36.0–46.0)
Hemoglobin: 9.5 g/dL — ABNORMAL LOW (ref 12.0–15.0)
Immature Granulocytes: 0 %
Lymphocytes Relative: 17 %
Lymphs Abs: 1.8 10*3/uL (ref 0.7–4.0)
MCH: 27 pg (ref 26.0–34.0)
MCHC: 32.8 g/dL (ref 30.0–36.0)
MCV: 82.4 fL (ref 80.0–100.0)
Monocytes Absolute: 0.5 10*3/uL (ref 0.1–1.0)
Monocytes Relative: 4 %
Neutro Abs: 8.6 10*3/uL — ABNORMAL HIGH (ref 1.7–7.7)
Neutrophils Relative %: 78 %
Platelets: 230 10*3/uL (ref 150–400)
RBC: 3.52 MIL/uL — ABNORMAL LOW (ref 3.87–5.11)
RDW: 13.7 % (ref 11.5–15.5)
WBC: 11.2 10*3/uL — ABNORMAL HIGH (ref 4.0–10.5)
nRBC: 0 % (ref 0.0–0.2)

## 2023-03-03 LAB — URINALYSIS, ROUTINE W REFLEX MICROSCOPIC
Bilirubin Urine: NEGATIVE
Glucose, UA: NEGATIVE mg/dL
Hgb urine dipstick: NEGATIVE
Ketones, ur: NEGATIVE mg/dL
Nitrite: NEGATIVE
Protein, ur: NEGATIVE mg/dL
Specific Gravity, Urine: 1.02 (ref 1.005–1.030)
pH: 6 (ref 5.0–8.0)

## 2023-03-03 LAB — PREGNANCY, URINE: Preg Test, Ur: POSITIVE — AB

## 2023-03-03 MED ORDER — ACETAMINOPHEN 500 MG PO TABS
1000.0000 mg | ORAL_TABLET | Freq: Once | ORAL | Status: AC
  Administered 2023-03-03: 1000 mg via ORAL
  Filled 2023-03-03: qty 2

## 2023-03-03 NOTE — ED Triage Notes (Signed)
See first nurse note. 

## 2023-03-03 NOTE — ED Notes (Addendum)
Spoke with L&D who recommended we grab heart tones and if WNL, to DC to follow up with OB, MD notified. HR obtained @130 

## 2023-03-03 NOTE — ED Provider Triage Note (Signed)
Emergency Medicine Provider Triage Evaluation Note  Elisabet Mucci , a 27 y.o. female  was evaluated in triage.  Pt complains of seeing flashing lights when she went from sitting to standing.  Reports that she felt very lightheaded.  She reports that she feels back to normal now. [redacted] weeks pregnant. No pain. Normal fetal movement.  Review of Systems  Positive: Lightheaded, flashing lights Negative: pain  Physical Exam  BP 126/75   Pulse (!) 106   Temp 98 F (36.7 C)   Resp 18   Ht 5\' 2"  (1.575 m)   Wt 108.9 kg   LMP 04/06/2022 (Exact Date)   SpO2 98%   BMI 43.90 kg/m  Gen:   Awake, no distress   Resp:  Normal effort  MSK:   Moves extremities without difficulty  Other:    Medical Decision Making  Medically screening exam initiated at 6:29 PM.  Appropriate orders placed.  Charlestyn Viramontes was informed that the remainder of the evaluation will be completed by another provider, this initial triage assessment does not replace that evaluation, and the importance of remaining in the ED until their evaluation is complete.     Jackelyn Hoehn, PA-C 03/03/23 1830

## 2023-03-03 NOTE — Discharge Instructions (Signed)
Please seek medical attention for any high fevers, chest pain, shortness of breath, change in behavior, persistent vomiting, bloody stool or any other new or concerning symptoms.  

## 2023-03-03 NOTE — ED Provider Notes (Signed)
[facility] Provider Note    Event Date/Time   First MD Initiated Contact with Patient 03/03/23 2136     (approximate)   History   Eye Problem   HPI  Bethany Velez is a 27 y.o. female who presents to the emergency department today because of concerns for vision changes and headache.  The patient states that her symptoms started after her 65-year-old was choking on something.  She has had similar symptoms in the past.  States that she has been seen in the emergency department for similar migraines that have also caused vision changes.  She describes bright lights with her vision changes.  She is roughly [redacted] weeks pregnant and states that when the episode happened she did feel increased pressure in her abdomen.  At the time my exam she states she is feeling good movement.     Physical Exam   Triage Vital Signs: ED Triage Vitals  Encounter Vitals Group     BP 03/03/23 1818 126/75     Systolic BP Percentile --      Diastolic BP Percentile --      Pulse Rate 03/03/23 1818 (!) 106     Resp 03/03/23 1818 18     Temp 03/03/23 1818 98 F (36.7 C)     Temp Source 03/03/23 2128 Oral     SpO2 03/03/23 1818 98 %     Weight 03/03/23 1827 240 lb (108.9 kg)     Height 03/03/23 1827 5\' 2"  (1.575 m)     Head Circumference --      Peak Flow --      Pain Score 03/03/23 1827 0     Pain Loc --      Pain Education --      Exclude from Growth Chart --     Most recent vital signs: Vitals:   03/03/23 1818 03/03/23 2128  BP: 126/75 (!) 115/52  Pulse: (!) 106 80  Resp: 18 16  Temp: 98 F (36.7 C) 97.8 F (36.6 C)  SpO2: 98% 100%   General: Awake, alert, oriented. CV:  Good peripheral perfusion. Regular rate and rhythm. Resp:  Normal effort. Lungs clear. Abd:  Non tender. Gravid.   ED Results / Procedures / Treatments   Labs (all labs ordered are listed, but only abnormal results are displayed) Labs Reviewed  COMPREHENSIVE METABOLIC PANEL - Abnormal;  Notable for the following components:      Result Value   Sodium 132 (*)    CO2 18 (*)    Glucose, Bld 100 (*)    Calcium 8.4 (*)    Albumin 2.6 (*)    All other components within normal limits  CBC WITH DIFFERENTIAL/PLATELET - Abnormal; Notable for the following components:   WBC 11.2 (*)    RBC 3.52 (*)    Hemoglobin 9.5 (*)    HCT 29.0 (*)    Neutro Abs 8.6 (*)    All other components within normal limits  URINALYSIS, ROUTINE W REFLEX MICROSCOPIC - Abnormal; Notable for the following components:   Color, Urine YELLOW (*)    APPearance HAZY (*)    Leukocytes,Ua LARGE (*)    Bacteria, UA RARE (*)    All other components within normal limits  PREGNANCY, URINE - Abnormal; Notable for the following components:   Preg Test, Ur POSITIVE (*)    All other components within normal limits     EKG  None   RADIOLOGY None   PROCEDURES:  Critical Care performed: No   MEDICATIONS ORDERED IN ED: Medications - No data to display   IMPRESSION / MDM / ASSESSMENT AND PLAN / ED COURSE  I reviewed the triage vital signs and the nursing notes.                              Differential diagnosis includes, but is not limited to, migraine, electrolyte abnormality, anemia  Patient's presentation is most consistent with acute presentation with potential threat to life or bodily function.   Patient presented to the emergency department today because of concerns for vision changes and headache.  Patient with history of migraines and states she has had similar symptoms in the past.  Patient did have a stressful experience prior to the symptoms starting and by the time my exam the patient is feeling better.  At this time I do think patient likely suffered from her typical migraine.  Blood work without concerning abnormalities.  Patient is not hypertensive.  Think it would be reasonable for patient be discharged home.      FINAL CLINICAL IMPRESSION(S) / ED DIAGNOSES   Final diagnoses:   Other migraine without status migrainosus, not intractable     Note:  This document was prepared using Dragon voice recognition software and may include unintentional dictation errors.    Phineas Semen, MD 03/03/23 2221

## 2023-03-03 NOTE — ED Triage Notes (Signed)
Pt comes with c/o dizziness and seeing flashing lights. Pt states her son earlier was choking on something and then all this started after they were about to call ems. Pt states she also got some left sided pain when this happened.   Pt is [redacted] weeks pregnant.   VSS and documented in chart.  LDR called and states to keep pt in ED for evaluation 1st and then consult them if needed.

## 2023-03-22 ENCOUNTER — Other Ambulatory Visit: Payer: Self-pay

## 2023-03-22 ENCOUNTER — Emergency Department: Payer: Medicaid Other

## 2023-03-22 ENCOUNTER — Encounter: Payer: Self-pay | Admitting: Emergency Medicine

## 2023-03-22 ENCOUNTER — Emergency Department
Admission: EM | Admit: 2023-03-22 | Discharge: 2023-03-22 | Disposition: A | Discharge status: Home / Self Care | Payer: Medicaid Other | Attending: Emergency Medicine | Admitting: Emergency Medicine

## 2023-03-22 DIAGNOSIS — O99891 Other specified diseases and conditions complicating pregnancy: Secondary | ICD-10-CM | POA: Insufficient documentation

## 2023-03-22 DIAGNOSIS — Z3A36 36 weeks gestation of pregnancy: Secondary | ICD-10-CM | POA: Diagnosis not present

## 2023-03-22 DIAGNOSIS — O26893 Other specified pregnancy related conditions, third trimester: Secondary | ICD-10-CM | POA: Diagnosis present

## 2023-03-22 DIAGNOSIS — M79662 Pain in left lower leg: Secondary | ICD-10-CM | POA: Diagnosis not present

## 2023-03-22 NOTE — Discharge Instructions (Signed)
 Your ultrasound did not reveal any blood clot.  Please follow-up with OB/GYN.  Patient aware to stay hydrated.  Please return for any new, worsening, or change in symptoms or other concerns.  It was a pleasure caring for you today.

## 2023-03-22 NOTE — ED Notes (Signed)
 See triage note  Presents with swelling and pain to left lower extremity   States pain started couple of days ago  describe pain as cramping

## 2023-03-22 NOTE — ED Triage Notes (Signed)
 Pt in via POV, sent over from PCP to rule out DVT to LLE.  Patient reports cramp and continued pain to left calf; states that PCP measured her calf and left was larger than right.  Patient is [redacted] weeks pregnant; denies any complications w/ pregnancy.  NAD noted at this time.

## 2023-03-22 NOTE — ED Provider Notes (Signed)
 [facility] Provider Note    Event Date/Time   First MD Initiated Contact with Patient 03/22/23 1551     (approximate)   History   DVT   HPI  Bethany Velez is a 28 y.o. female G3, P2 currently [redacted] weeks pregnant who presents today for evaluation of left calf pain.  Patient reports that she was sent to the emergency department by her PCP for evaluation of DVT.  Patient reports that she has never had a DVT before.  She reports that she is having normal fetal movement, no leakage of fluids, no pregnancy concerns today.  She denies chest pain or shortness of breath.  No fevers or chills.  No injuries to her calf.  No back pain or belly pain.  Patient Active Problem List   Diagnosis Date Noted   Encounter for induction of labor 06/04/2021   Pregnancy 06/03/2021   Uterine contractions 06/03/2021   Amniotic fluid leaking 04/24/2021   Non-reactive NST (non-stress test) 04/11/2021   Infection of skin 07/08/2015   Obesity, unspecified 01/10/2015   Hx of cardiac murmur 10/31/2014   Rash and other nonspecific skin eruption 06/19/2013          Physical Exam   Triage Vital Signs: ED Triage Vitals  Encounter Vitals Group     BP 03/22/23 1350 110/72     Systolic BP Percentile --      Diastolic BP Percentile --      Pulse Rate 03/22/23 1350 88     Resp 03/22/23 1350 15     Temp 03/22/23 1350 97.9 F (36.6 C)     Temp Source 03/22/23 1350 Oral     SpO2 03/22/23 1350 99 %     Weight 03/22/23 1353 244 lb (110.7 kg)     Height 03/22/23 1353 5' 2 (1.575 m)     Head Circumference --      Peak Flow --      Pain Score 03/22/23 1353 5     Pain Loc --      Pain Education --      Exclude from Growth Chart --     Most recent vital signs: Vitals:   03/22/23 1350 03/22/23 1559  BP: 110/72   Pulse: 88   Resp: 15   Temp: 97.9 F (36.6 C)   SpO2: 99% 99%    Physical Exam Vitals and nursing note reviewed.  Constitutional:      General: Awake and  alert. No acute distress.    Appearance: Normal appearance. The patient is obese.  HENT:     Head: Normocephalic and atraumatic.     Mouth: Mucous membranes are moist.  Eyes:     General: PERRL. Normal EOMs        Right eye: No discharge.        Left eye: No discharge.     Conjunctiva/sclera: Conjunctivae normal.  Cardiovascular:     Rate and Rhythm: Normal rate and regular rhythm.     Pulses: Normal pulses.  Pulmonary:     Effort: Pulmonary effort is normal. No respiratory distress.     Breath sounds: Normal breath sounds.  Abdominal:     Abdomen is gravid. There is no abdominal tenderness. No rebound or guarding. No distention. Musculoskeletal:        General: No swelling. Normal range of motion.     Cervical back: Normal range of motion and neck supple.  Back: No midline tenderness. Strength and sensation 5/5 to  bilateral lower extremities. Normal great toe extension against resistance. Normal sensation throughout feet. Normal patellar reflexes. Negative SLR and opposite SLR bilaterally. Negative FABER test Skin:    General: Skin is warm and dry.     Capillary Refill: Capillary refill takes less than 2 seconds.     Findings: No rash.  Neurological:     Mental Status: The patient is awake and alert.      ED Results / Procedures / Treatments   Labs (all labs ordered are listed, but only abnormal results are displayed) Labs Reviewed - No data to display   EKG     RADIOLOGY I independently reviewed and interpreted imaging and agree with radiologists findings.     PROCEDURES:  Critical Care performed:   Procedures   MEDICATIONS ORDERED IN ED: Medications - No data to display   IMPRESSION / MDM / ASSESSMENT AND PLAN / ED COURSE  I reviewed the triage vital signs and the nursing notes.   Differential diagnosis includes, but is not limited to, DVT, muscle spasm, dehydration, muscle strain, lumbar radiculopathy.  Patient is awake and alert, hemodynamically  stable and afebrile.  She is nontoxic in appearance.  She has no pitting edema on exam.  Negative straight leg raise, no paresthesias, normal strength and sensation to her leg, not consistent with radicular symptoms.  Ultrasound obtained in triage for evaluation of DVT.  This was negative for DVT.  Patient is reassured by these results.  We discussed importance of proper hydration, proper p.o. intake.  We also discussed return precautions.  Patient understands and agrees with plan.  She was discharged in stable condition.  She is ambulatory with a steady gait.  Patient's presentation is most consistent with acute complicated illness / injury requiring diagnostic workup.      FINAL CLINICAL IMPRESSION(S) / ED DIAGNOSES   Final diagnoses:  Pain of left calf     Rx / DC Orders   ED Discharge Orders     None        Note:  This document was prepared using Dragon voice recognition software and may include unintentional dictation errors.   Kyheem [place] E, PA-C 03/22/23 1652    Arlander Charleston, MD 03/23/23 [phone]

## 2023-03-22 NOTE — ED Provider Triage Note (Signed)
 Emergency Medicine Provider Triage Evaluation Note  Bethany Velez , a 28 y.o. female  was evaluated in triage.  Pt complains of pain in left lower calf for the past few days. [redacted] weeks pregnant. Sent by MD for rule out DVT. No history of PE/DVT.   Physical Exam  BP 110/72   Pulse 88   Temp 97.9 F (36.6 C) (Oral)   Resp 15   Ht 5' 2 (1.575 m)   Wt 110.7 kg   LMP 04/06/2022 (Exact Date)   SpO2 99%   BMI 44.63 kg/m  Gen:   Awake, no distress   Resp:  Normal effort  MSK:   Moves extremities without difficulty  Other:    Medical Decision Making  Medically screening exam initiated at 1:54 PM.  Appropriate orders placed.  Bethany Velez was informed that the remainder of the evaluation will be completed by another provider, this initial triage assessment does not replace that evaluation, and the importance of remaining in the ED until their evaluation is complete.  US  ordered.   Bethany Kirk NOVAK, FNP 03/22/23 1355

## 2023-04-06 ENCOUNTER — Inpatient Hospital Stay: Admit: 2023-04-06 | Discharge: 2023-04-07 | Payer: PRIVATE HEALTH INSURANCE

## 2023-04-06 ENCOUNTER — Ambulatory Visit: Admit: 2023-04-06 | Discharge: 2023-04-07 | Payer: PRIVATE HEALTH INSURANCE

## 2023-04-09 ENCOUNTER — Encounter
Admit: 2023-04-09 | Discharge: 2023-04-11 | Disposition: A | Discharge status: Home / Self Care | Payer: PRIVATE HEALTH INSURANCE | Attending: Anesthesiology

## 2023-04-09 ENCOUNTER — Inpatient Hospital Stay
Admit: 2023-04-09 | Discharge: 2023-04-11 | Disposition: A | Discharge status: Home / Self Care | Payer: PRIVATE HEALTH INSURANCE

## 2023-04-09 ENCOUNTER — Encounter: Admit: 2023-04-09 | Disposition: A | Discharge status: Home / Self Care | Payer: PRIVATE HEALTH INSURANCE

## 2023-04-11 MED ORDER — ACETAMINOPHEN 325 MG TABLET
ORAL_TABLET | Freq: Four times a day (QID) | ORAL | 0 refills | 12.00 days | Status: CP | PRN
Start: 2023-04-11 — End: ?
  Filled 2023-04-11: qty 90, 12d supply, fill #0

## 2023-04-11 MED ORDER — IBUPROFEN 600 MG TABLET
ORAL_TABLET | Freq: Four times a day (QID) | ORAL | 0 refills | 23 days | Status: CP
Start: 2023-04-11 — End: ?
  Filled 2023-04-11: qty 90, 22d supply, fill #0

## 2023-04-11 MED ORDER — FERROUS GLUCONATE 324 MG (38 MG IRON) TABLET
ORAL_TABLET | Freq: Every day | ORAL | 11 refills | 30.00 days | Status: CP
Start: 2023-04-11 — End: 2024-04-10
  Filled 2023-04-11: qty 30, 30d supply, fill #0

## 2023-05-14 ENCOUNTER — Ambulatory Visit
Admit: 2023-05-14 | Discharge: 2023-05-15 | Payer: PRIVATE HEALTH INSURANCE | Attending: Physician Assistant | Primary: Physician Assistant

## 2023-05-14 DIAGNOSIS — L732 Hidradenitis suppurativa: Principal | ICD-10-CM

## 2023-05-14 MED ORDER — RIFAMPIN 300 MG CAPSULE
ORAL_CAPSULE | Freq: Two times a day (BID) | ORAL | 3 refills | 30 days | Status: CP
Start: 2023-05-14 — End: ?

## 2023-05-14 MED ORDER — CLINDAMYCIN HCL 300 MG CAPSULE
ORAL_CAPSULE | Freq: Two times a day (BID) | ORAL | 3 refills | 30.00 days | Status: CP
Start: 2023-05-14 — End: ?

## 2023-05-14 MED ORDER — CLOBETASOL 0.05 % TOPICAL OINTMENT
3 refills | 0.00 days | Status: CP
Start: 2023-05-14 — End: ?

## 2023-05-14 MED ORDER — CLINDAMYCIN 1 % LOTION
5 refills | 0.00 days | Status: CP
Start: 2023-05-14 — End: ?

## 2023-08-12 ENCOUNTER — Ambulatory Visit
Admit: 2023-08-12 | Discharge: 2023-08-13 | Payer: Medicaid (Managed Care) | Attending: Physician Assistant | Primary: Physician Assistant

## 2023-08-12 DIAGNOSIS — L732 Hidradenitis suppurativa: Principal | ICD-10-CM

## 2023-08-12 DIAGNOSIS — Z79899 Other long term (current) drug therapy: Principal | ICD-10-CM

## 2023-08-12 MED ORDER — COSENTYX PEN 150 MG/ML SUBCUTANEOUS PEN INJECTOR
SUBCUTANEOUS | 11 refills | 28.00000 days | Status: CP
Start: 2023-08-12 — End: ?

## 2023-08-12 MED ORDER — RIFAMPIN 300 MG CAPSULE
ORAL_CAPSULE | Freq: Two times a day (BID) | ORAL | 3 refills | 30.00000 days | Status: CP
Start: 2023-08-12 — End: ?

## 2023-08-12 MED ORDER — CLINDAMYCIN 1 % LOTION
TOPICAL | 5 refills | 0.00000 days | Status: CP
Start: 2023-08-12 — End: ?

## 2023-08-12 MED ORDER — COSENTYX UNOREADY PEN 300 MG/2 ML SUBCUTANEOUS PEN INJECTOR
SUBCUTANEOUS | 0 refills | 0.00000 days | Status: CP
Start: 2023-08-12 — End: ?

## 2023-08-12 MED ORDER — CLINDAMYCIN HCL 300 MG CAPSULE
ORAL_CAPSULE | Freq: Two times a day (BID) | ORAL | 3 refills | 30.00000 days | Status: CP
Start: 2023-08-12 — End: ?

## 2023-08-13 MED ORDER — COSENTYX UNOREADY PEN 300 MG/2 ML SUBCUTANEOUS PEN INJECTOR
SUBCUTANEOUS | 11 refills | 0.00000 days | Status: CP
Start: 2023-08-13 — End: ?

## 2023-08-16 DIAGNOSIS — L732 Hidradenitis suppurativa: Principal | ICD-10-CM

## 2023-08-30 NOTE — Unmapped (Signed)
 [facility]    Patient Onboarding/Medication Counseling    Shawna Beck is a 28 y.o. female with hidradenitis suppurativa who I am counseling today on initiation of therapy.  I am speaking to the patient.    Was a Nurse, learning disability used for this call? No    Verified patient's date of birth / HIPAA.    Specialty medication(s) to be sent: Inflammatory Disorders: Cosentyx      Non-specialty medications/supplies to be sent: sharps container      Medications not needed at this time: n/a         Cosentyx (secukinumab)      Dosage: Hidradenitis Suppurativa: Inject 300mg  under the skin at weeks 0, 1, 2, 3, and 4 followed by 300 mg every 4 weeks; consider an increase to 300 mg every 2 weeks in patients who have an inadequate response      Lab tests required prior to treatment initiation:  Tuberculosis: Tuberculosis screening resulted in a non-reactive Quantiferon TB Gold assay. 08/12/2023      Administration:       Prefilled Unoready?? auto-injector pen  Gather all supplies needed for injection on a clean, flat working surface: medication pen removed from packaging, alcohol swab, sharps container, etc.  Look at the medication label - look for correct medication, correct dose, and check the expiration date  Look at the medication - the liquid visible in the window on the side of the pen device should appear clear and colorless to slightly yellow  Lay the auto-injector pen on a flat surface and allow it to warm up to room temperature for at least  30-45 minutes  Select injection site - you can use the front of your thigh or your belly (but not the area 2 inches around your belly button); if someone else is giving you the injection you can also use your upper arm in the skin covering your triceps muscle  Prepare injection site - wash your hands and clean the skin at the injection site with an alcohol swab and let it air dry, do not touch the injection site again before the injection  Pull off the purple safety cap in the direction of the arrow, do not remove until immediately prior to injection and do not touch the red needle cover  Put the red needle cover against your skin at the injection site at a 90 degree angle, hold the pen such that you can see the clear medication window  Press down and hold the pen firmly against your skin, there will be a click when the injection starts  Continue to hold the pen firmly against your skin for about 10-15 seconds - the window will start to turn solid green  There will be a second click sound when the injection is almost complete, verify the window is solid green to indicate the injection is complete and then pull the pen away from your skin  Dispose of the used auto-injector pen immediately in your sharps disposal container the needle will be covered automatically  If you see any blood at the injection site, press a cotton ball or gauze on the site and maintain pressure until the bleeding stops, do not rub the injection site      Adherence/Missed dose instructions:  If your injection is given more than 4 days after your scheduled injection date - consult your pharmacist for additional instructions on how to adjust your dosing schedule.        Goals of  Therapy       Hidradenitis Suppurativa  Reduce the frequency and severity of new lesions  Minimize pain and suppuration  Prevent disease progression and limit scarring  Maintenance of effective psychosocial functioning        Side Effects & Monitoring Parameters     Injection site reaction (redness, irritation, inflammation localized to the site of administration)  Signs of a common cold - minor sore throat, runny or stuffy nose, etc.  Diarrhea    The following side effects should be reported to the provider:  Signs of a hypersensitivity reaction - rash; hives; itching; red, swollen, blistered, or peeling skin; wheezing; tightness in the chest or throat; difficulty breathing, swallowing, or talking; swelling of the mouth, face, lips, tongue, or throat; etc.  Reduced immune function - report signs of infection such as fever; chills; body aches; very bad sore throat; ear or sinus pain; cough; more sputum or change in color of sputum; pain with passing urine; wound that will not heal, etc.  Also at a slightly higher risk of some malignancies (mainly skin and blood cancers) due to this reduced immune function.  In the case of signs of infection - the patient should hold the next dose of Cosentyx?? and call your primary care provider to ensure adequate medical care.  Treatment may be resumed when infection is treated and patient is asymptomatic.  Muscle pain or weakness  Shortness of breath      Warnings, Precautions, & Contraindications     Have your bloodwork checked as you have been told by your prescriber  Talk with your doctor if you are pregnant, planning to become pregnant, or breastfeeding  Discuss the possible need for holding your dose(s) of Cosentyx?? when a planned procedure is scheduled with the prescriber as it may delay healing/recovery timeline       Drug/Food Interactions     Medication list reviewed in Epic. The patient was instructed to inform the care team before taking any new medications or supplements. No drug interactions identified.   If you have a latex allergy use caution when handling, the needle cap of the Cosentyx?? prefilled syringe and the safety cap for the Cosentyx Sensoready?? pen contains a derivative of natural rubber latex. Unoready?? pen does NOT contain latex.   Talk with you prescriber or pharmacist before receiving any live vaccinations while taking this medication and after you stop taking it      Storage, Handling Precautions, & Disposal     Store this medication in the refrigerator.  Do not freeze  May store intact Sensoready pens and 150 mg/mL prefilled syringes at <=30??C (<=86??F) for up to 4 days; may return to the refrigerator if unused  Store in original packaging, protected from light  Do not shake  Dispose of used syringes/pens in a sharps disposal container           Current Medications (including OTC/herbals), Comorbidities and Allergies     Current Medications[1]    Allergies[2]    Problem List[3]    Medication list has been reviewed and updated in Epic: Yes    Allergies have been reviewed and updated in Epic: Yes    Appropriateness of Therapy     Acute infections noted within Epic:  No active infections  Patient reported infection: None    Is the medication and dose appropriate based on diagnosis, medication list, comorbidities, allergies, medical history, patient???s ability to self-administer the medication, and therapeutic goals? Yes    Prescription has  been clinically reviewed: Yes      Baseline Quality of Life Assessment      How many days over the past month did your condition  keep you from your normal activities? For example, brushing your teeth or getting up in the morning. Patient declined to answer    Financial Information     Medication Assistance provided: Prior Authorization    Anticipated copay of $4 / 31 (LD) and $4 / 28 (MD)  reviewed with patient. Verified delivery address.    Delivery Information     Scheduled delivery date: 6/18    Expected start date: 6/19      Medication will be delivered via UPS to the prescription address in [facility].  This shipment will not require a signature.      Explained the services we provide at [facility] and that each month we would call to set up refills.  Stressed importance of returning phone calls so that we could ensure they receive their medications in time each month.  Informed patient that we should be setting up refills 7-10 days prior to when they will run out of medication.  A pharmacist will reach out to perform a clinical assessment periodically.  Informed patient that a welcome packet, containing information about our pharmacy and other support services, a Notice of Privacy Practices, and a drug information handout will be sent.      The patient or caregiver noted above participated in the development of this care plan and knows that they can request review of or adjustments to the care plan at any time.      Patient or caregiver verbalized understanding of the above information as well as how to contact the pharmacy at [phone] option 4 with any questions/concerns.  The pharmacy is open Monday through Friday 8:30am-4:30pm.  A pharmacist is available 24/7 via pager to answer any clinical questions they may have.    Patient Specific Needs     Does the patient have any physical, cognitive, or cultural barriers? No    Does the patient have adequate living arrangements? (i.e. the ability to store and take their medication appropriately) Yes    Did you identify any home environmental safety or security hazards? No    Patient prefers to have medications discussed with  Patient     Is the patient or caregiver able to read and understand education materials at a high school level or above? Yes    Patient's primary language is  English     Is the patient high risk? No    Does the patient have an additional or emergency contact listed in their chart? Yes    SOCIAL DETERMINANTS OF HEALTH     At the [facility], we have learned that life circumstances - like trouble affording food, housing, utilities, or transportation can affect the health of many of our patients.   That is why we wanted to ask: are you currently experiencing any life circumstances that are negatively impacting your health and/or quality of life? Patient declined to answer    Social Drivers of Health     Food Insecurity: Not on file   Tobacco Use: High Risk (08/12/2023)    Patient History     Smoking Tobacco Use: Some Days     Smokeless Tobacco Use: Never     Passive Exposure: Not on file   Transportation Needs: Not on file   Alcohol Use: Not on file  Housing: Not on file   Physical Activity: Not on file   Utilities: Not on file   Stress: Not on file Interpersonal Safety: Not on file   Substance Use: Not on file (01/18/2023)   Intimate Partner Violence: Not on file   Social Connections: Not on file   Financial Resource Strain: Not on file   Health Literacy: Not on file   Internet Connectivity: Not on file       Would you be willing to receive help with any of the needs that you have identified today? Not applicable       Velma Ober, PharmD  [facility] Specialty Pharmacist         [1]   Current Outpatient Medications   Medication Sig Dispense Refill    acetaminophen (TYLENOL) 325 MG tablet Take 2 tablets (650 mg total) by mouth every six (6) hours as needed. 90 tablet 0    acyclovir (ZOVIRAX) 200 MG capsule Take 1 capsule (200 mg total) by mouth every four (4) hours.      caffeine 200 mg Tab Take 1 tablet (200 mg total) by mouth every four (4) hours as needed. 30 tablet 2    clindamycin (CLEOCIN T) 1 % lotion Apply BID to active areas 60 mL 5    clindamycin (CLEOCIN) 300 MG capsule Take 1 capsule (300 mg total) by mouth two (2) times a day. 60 capsule 3    clobetasol (TEMOVATE) 0.05 % ointment Apply twice daily as spot treatment for up to 5-7 days as needed for HS flares. 60 g 3    ferrous gluconate 324 MG tablet Take 1 tablet (324 mg total) by mouth in the morning. 30 tablet 11    ibuprofen (MOTRIN) 600 MG tablet Take 1 tablet (600 mg total) by mouth every six (6) hours. 90 tablet 0    multivitamin, prenatal, folic acid-iron, 27-1 mg Tab Take 1 tablet by mouth daily. 30 tablet 11    omeprazole (PRILOSEC) 40 MG capsule Take 1 capsule (40 mg total) by mouth.      rifAMPin (RIFADIN) 300 MG capsule Take 1 capsule (300 mg total) by mouth two (2) times a day. 60 capsule 3    secukinumab (COSENTYX UNOREADY PEN) 300 mg/2 mL PnIj Inject the contents of 1 pen (300 mg) under the skin on week 0, 1, 2, 3, and 4 for loading dose 10 mL 0    secukinumab (COSENTYX UNOREADY PEN) 300 mg/2 mL PnIj Inject the contents of 1 pen (300 mg) under the skin every twenty-eight (28) days. For maintenance dose 2 mL 11     No current facility-administered medications for this visit.   [2] No Known Allergies  [3]   Patient Active Problem List  Diagnosis    Allergic rhinitis    Hidradenitis suppurativa    Hx of cardiac murmur    Gastroesophageal reflux disease    Hyperglycemia    Obesity    Nondependent alcohol abuse, episodic drinking behavior    Contraceptive management    Steatosis of liver    Infection of skin    Other specified disorders of teeth and supporting structures (CODE)    Encounter for general adult medical examination without abnormal findings    Special screening examination for other specified chlamydial diseases    Chiari I malformation       History of pre-eclampsia    Chronic migraine with aura

## 2023-08-30 NOTE — Unmapped (Signed)
 [facility] Specialty Medication Onboarding    Specialty Medication: COSENTYX UNOREADY PEN 300 mg/2 mL Pnij (secukinumab)  Prior Authorization: Approved   Financial Assistance: No - copay  <$25  Final Copay/Day Supply: ($4 / 56 LD) ($4 / 28 MD)    Insurance Restrictions: None     Notes to Pharmacist:   Credit Card on File: no  Start Date on Rx:  08/12/23    The triage team has completed the benefits investigation and has determined that the patient is able to fill this medication at [facility]. Please contact the patient to complete the onboarding or follow up with the prescribing physician as needed.

## 2023-08-31 MED ORDER — EMPTY CONTAINER
2 refills | 0.00000 days
Start: 2023-08-31 — End: ?

## 2023-09-01 DIAGNOSIS — L732 Hidradenitis suppurativa: Principal | ICD-10-CM

## 2023-09-01 MED ORDER — COSENTYX UNOREADY PEN 300 MG/2 ML SUBCUTANEOUS PEN INJECTOR
SUBCUTANEOUS | 11 refills | 0.00000 days | Status: CP
Start: 2023-09-01 — End: ?
  Filled 2023-09-01: qty 10, 56d supply, fill #0

## 2023-09-01 MED FILL — EMPTY CONTAINER: 90 days supply | Qty: 1 | Fill #0

## 2023-10-19 DIAGNOSIS — L732 Hidradenitis suppurativa: Principal | ICD-10-CM

## 2023-10-19 MED ORDER — COSENTYX UNOREADY PEN 300 MG/2 ML SUBCUTANEOUS PEN INJECTOR
SUBCUTANEOUS | 11 refills | 0.00000 days | Status: CP
Start: 2023-10-19 — End: ?
  Filled 2023-11-11: qty 6, 84d supply, fill #0

## 2023-10-19 NOTE — Unmapped (Signed)
 Shawna Beck reports her HS is improved quite a bit - she's had no new flaring and less pain and swelling. She continues to have some active HS spots, but reports only 3-4 now and they are improved from baseline.     [facility] Clinical Assessment & Refill Coordination Note    Shawna Beck, DOB: [birth year]  Phone: There are no phone numbers on file.    All above HIPAA information was verified with patient.     Was a Nurse, learning disability used for this call? No    Specialty Medication(s):   Inflammatory Disorders: Cosentyx      Current Medications[1]     Changes to medications: Shantrell reports no changes at this time.    Medication list has been reviewed and updated in Epic: Yes    Allergies[2]    Changes to allergies: No    Allergies have been reviewed and updated in Epic: Yes    SPECIALTY MEDICATION ADHERENCE     Cosentyx  - 0 left  Medication Adherence    Patient reported X missed doses in the last month: 0  Specialty Medication: Cosentyx           Specialty medication(s) dose(s) confirmed: Regimen is correct and unchanged.     Are there any concerns with adherence? No    Adherence counseling provided? Not needed    CLINICAL MANAGEMENT AND INTERVENTION      Clinical Benefit Assessment:    Do you feel the medicine is effective or helping your condition? Yes    Clinical Benefit counseling provided? Not needed    Adverse Effects Assessment:    Are you experiencing any side effects? No    Are you experiencing difficulty administering your medicine? No    Quality of Life Assessment:    Quality of Life    Rheumatology  Oncology  Dermatology  1. What impact has your specialty medication had on the symptoms of your skin condition (i.e. itchiness, soreness, stinging)?: Some  2. What impact has your specialty medication had on your comfort level with your skin?: Some  Cystic Fibrosis          How many days over the past month did your HS  keep you from your normal activities? For example, brushing your teeth or getting up in the morning. Patient declined to answer    Have you discussed this with your provider? Not needed    Acute Infection Status:    Acute infections noted within Epic:  No active infections    Patient reported infection: None    Therapy Appropriateness:    Is therapy appropriate based on current medication list, adverse reactions, adherence, clinical benefit and progress toward achieving therapeutic goals? Yes, therapy is appropriate and should be continued     Clinical Intervention:    Was an intervention completed as part of this clinical assessment? No    DISEASE/MEDICATION-SPECIFIC INFORMATION      For patients on injectable medications: Patient currently has 0 doses left.  Next injection is scheduled for 8/14, 9/11 10/9 (trying to send 3 month supply).    Chronic Inflammatory Diseases: Have you experienced any flares in the last month? No  Has this been reported to your provider? No    PATIENT SPECIFIC NEEDS     Does the patient have any physical, cognitive, or cultural barriers? No    Is the patient high risk? No    Does the patient require physician intervention or other additional services (i.e., nutrition, smoking cessation,  social work)? No    Does the patient have an additional or emergency contact listed in their chart? Yes    SOCIAL DETERMINANTS OF HEALTH     At the [facility], we have learned that life circumstances - like trouble affording food, housing, utilities, or transportation can affect the health of many of our patients.   That is why we wanted to ask: are you currently experiencing any life circumstances that are negatively impacting your health and/or quality of life? Patient declined to answer    Social Drivers of Health     Food Insecurity: Not on file   Tobacco Use: High Risk (08/12/2023)    Patient History     Smoking Tobacco Use: Some Days     Smokeless Tobacco Use: Never     Passive Exposure: Not on file   Transportation Needs: Not on file   Alcohol Use: Not on file Housing: Not on file   Physical Activity: Not on file   Utilities: Not on file   Stress: Not on file   Interpersonal Safety: Not on file   Substance Use: Not on file (01/18/2023)   Intimate Partner Violence: Not on file   Social Connections: Not on file   Financial Resource Strain: Not on file   Health Literacy: Not on file   Internet Connectivity: Not on file       Would you be willing to receive help with any of the needs that you have identified today? Not applicable       SHIPPING     Specialty Medication(s) to be Shipped:   Inflammatory Disorders: Cosentyx     Other medication(s) to be shipped: No additional medications requested for fill at this time     Changes to insurance: No    Cost and Payment: Patient has a copay of $4. They are aware and have authorized the pharmacy to charge the credit card on file.    Delivery Scheduled: Yes, Expected medication delivery date: 8/12.  However, Rx request for refills was sent to the provider as there are none remaining.     Medication will be delivered via Same Day Courier to the confirmed prescription address in [facility].    The patient will receive a drug information handout for each medication shipped and additional FDA Medication Guides as required.  Verified that patient has previously received a Conservation officer, historic buildings and a Surveyor, mining.    The patient or caregiver noted above participated in the development of this care plan and knows that they can request review of or adjustments to the care plan at any time.      All of the patient's questions and concerns have been addressed.    [facility] Specialty Pharmacist         [1]   Current Outpatient Medications   Medication Sig Dispense Refill    acetaminophen  (TYLENOL ) 325 MG tablet Take 2 tablets (650 mg total) by mouth every six (6) hours as needed. 90 tablet 0    acyclovir (ZOVIRAX) 200 MG capsule Take 1 capsule (200 mg total) by mouth every four (4) hours. caffeine  200 mg Tab Take 1 tablet (200 mg total) by mouth every four (4) hours as needed. 30 tablet 2    clindamycin  (CLEOCIN  T) 1 % lotion Apply BID to active areas 60 mL 5    clindamycin  (CLEOCIN ) 300 MG capsule Take 1 capsule (300 mg total) by mouth  two (2) times a day. 60 capsule 3    clobetasol  (TEMOVATE ) 0.05 % ointment Apply twice daily as spot treatment for up to 5-7 days as needed for HS flares. 60 g 3    empty container Misc USE AS DIRECTED 1 each 2    ferrous gluconate  324 MG tablet Take 1 tablet (324 mg total) by mouth in the morning. 30 tablet 11    ibuprofen  (MOTRIN ) 600 MG tablet Take 1 tablet (600 mg total) by mouth every six (6) hours. 90 tablet 0    multivitamin, prenatal, folic acid -iron , 27-1 mg Tab Take 1 tablet by mouth daily. 30 tablet 11    omeprazole (PRILOSEC) 40 MG capsule Take 1 capsule (40 mg total) by mouth.      rifAMPin  (RIFADIN ) 300 MG capsule Take 1 capsule (300 mg total) by mouth two (2) times a day. 60 capsule 3    secukinumab  (COSENTYX  UNOREADY PEN) 300 mg/2 mL PnIj Inject the contents of 1 pen (300 mg) under the skin on week 0, 1, 2, 3, and 4 for loading dose 10 mL 0    secukinumab  (COSENTYX  UNOREADY PEN) 300 mg/2 mL PnIj Inject the contents of 1 pen (300 mg) under the skin every twenty-eight (28) days. For maintenance dose 2 mL 11     No current facility-administered medications for this visit.   [2] No Known Allergies

## 2023-10-21 ENCOUNTER — Ambulatory Visit
Admission: RE | Admit: 2023-10-21 | Discharge: 2023-10-21 | Disposition: A | Discharge status: Home / Self Care | Source: Ambulatory Visit | Attending: Physician Assistant | Admitting: Physician Assistant

## 2023-10-21 ENCOUNTER — Other Ambulatory Visit: Payer: Self-pay | Admitting: Physician Assistant

## 2023-10-21 DIAGNOSIS — M25562 Pain in left knee: Secondary | ICD-10-CM

## 2023-10-21 NOTE — Unmapped (Unsigned)
 [place] [facility] CONSULTATION       REFERRING PROVIDER:    Pcp, None Per Patient  [address] [place],  [place] [address]    PRIMARY CARE PROVIDER: [facility] Svc, Charles D       ASSESSMENT:      ***      PLAN:        ***    -rtc *** months    These recommendations, as well as other treatment options were discussed with the {companion:5061::patient} today. Questions were answered.  I gave the patient my contact information with instructions to call if any questions or concerns.  Likewise, the patient???s other care providers should feel free to contact me.    Ozell Hudson, MD, [facility]  Division of Gastroenterology and Hepatology  [employer]               HPI: Shawna Beck is a 28 y.o. female with chronic migraine, HS, obesity, seen in consultation at the request of Dr. Freddrick for evaluation of ***    Seen by our hepatology division once in 2017 mostly for steatosis, but also noted 2y of nausea, vomiting, and epigastric pain. Pain worse after eating, nausea in morning. Daily BMs can be hard or loose. She thought omeprazole 40mg  daily made her epigastric pain worse so stopped after a week. During workup for epigastric pain discovered to have steatosis. She had a history of some binge drinking, but this thought to be mainly driven by MASLD. She did have cholelithiasis. She was sent for EGD, which was normal, but no biopsies taken.    She is referred back in 05/2022 (17 months ago) for dysphagia. She has since had several children, and follows with [place] derm for significant hidradenitis, going through multiple antibiotics as well as Humira  (caused HSV flares), and currently on Cosentyx .      Seen by [place] [facility] and had negative NPL, referred here****      EGD (is this new since last EGD)? Yeast / infectious?? Straight to pH? Barium?****  Re-trial PPI?    PRIOR THERAPIES:     PRIOR DIAGNOSTIC EVALUATION:   04/06/22 pelvic US : IMPRESSION:   1. IUD appears to have the side arms obliquely oriented anteriorly   and posteriorly, located within the myometrium as opposed to being   oriented towards the fallopian tubes.   2. Otherwise normal pelvic ultrasound.     02/26/22 CT A/P: IMPRESSION:   1. Normal appendix.   2. Cholelithiasis, as seen on 04/30/2015 ultrasound. No definite   gallbladder wall inflammatory changes.   3. Diffuse fatty infiltration of the liver.   4. Mild to moderate splenomegaly.   5. Small fat containing umbilical hernia.     09/2015 EGD nl throughout, though no biopsies    08/2015 ASMA 1:20, ANA 1:160; HCV neg, HBV and HAV immune    08/2015 Fibroscan 7.6 kPA    04/2015 RUQ US : IMPRESSION:   Cholelithiasis, without sonographic signs of acute cholecystitis or   biliary dilatation.   Diffuse hepatic steatosis/ hepatocellular disease.     PAST MEDICAL HISTORY:  Past Medical History[1]  PAST SURGICAL HISTORY:  Past Surgical History[2]  MEDICATIONS:    Current Outpatient Medications:     acetaminophen  (TYLENOL ) 325 MG tablet, Take 2 tablets (650 mg total) by mouth every six (6) hours as needed., Disp: 90 tablet, Rfl: 0    acyclovir (ZOVIRAX) 200 MG capsule, Take 1 capsule (200 mg total) by mouth every four (4)  hours., Disp: , Rfl:     caffeine  200 mg Tab, Take 1 tablet (200 mg total) by mouth every four (4) hours as needed., Disp: 30 tablet, Rfl: 2    clindamycin  (CLEOCIN  T) 1 % lotion, Apply BID to active areas, Disp: 60 mL, Rfl: 5    clindamycin  (CLEOCIN ) 300 MG capsule, Take 1 capsule (300 mg total) by mouth two (2) times a day., Disp: 60 capsule, Rfl: 3    clobetasol  (TEMOVATE ) 0.05 % ointment, Apply twice daily as spot treatment for up to 5-7 days as needed for HS flares., Disp: 60 g, Rfl: 3    empty container Misc, USE AS DIRECTED, Disp: 1 each, Rfl: 2    ferrous gluconate  324 MG tablet, Take 1 tablet (324 mg total) by mouth in the morning., Disp: 30 tablet, Rfl: 11    ibuprofen  (MOTRIN ) 600 MG tablet, Take 1 tablet (600 mg total) by mouth every six (6) hours., Disp: 90 tablet, Rfl: 0 multivitamin, prenatal, folic acid -iron , 27-1 mg Tab, Take 1 tablet by mouth daily., Disp: 30 tablet, Rfl: 11    omeprazole (PRILOSEC) 40 MG capsule, Take 1 capsule (40 mg total) by mouth., Disp: , Rfl:     rifAMPin  (RIFADIN ) 300 MG capsule, Take 1 capsule (300 mg total) by mouth two (2) times a day., Disp: 60 capsule, Rfl: 3    secukinumab  (COSENTYX  UNOREADY PEN) 300 mg/2 mL PnIj, Inject the contents of 1 pen (300 mg) under the skin on week 0, 1, 2, 3, and 4 for loading dose, Disp: 10 mL, Rfl: 0    secukinumab  (COSENTYX  UNOREADY PEN) 300 mg/2 mL PnIj, Inject the contents of 1 pen (300 mg) under the skin every twenty-eight (28) days. For maintenance dose, Disp: 2 mL, Rfl: 11    Antibiotics[3]      Antibiotics[4]    ALLERGIES:  Patient has no known allergies.    SOCIAL HISTORY:  Social History[5]  FAMILY HISTORY:  family history is not on file.      VITAL SIGNS AND PHYSICAL EXAM:  There were no vitals taken for this visit.  {GI Exam Consult:21023051}  REVIEW OF SYSTEMS:   {[facility] IP GI MND:6959876}         [1]   Past Medical History:  Diagnosis Date    Alcohol abuse, episodic 03/06/2014    Anemia     iron  deficiency anemia (taking iron )    Chiari malformation type I    09/20/2017    Dental caries     Dysrhythmia     iron  is low increasing heart rate according to the patient    Fatty liver     Gastritis     Generalized anxiety disorder with panic attacks 12/17/2017    GERD (gastroesophageal reflux disease)     GERD (gastroesophageal reflux disease) 03/20/2015    Herpes 10/23/2016    Hidradenitis suppurativa 01/30/2015    Migraine     once a month    Obesity     Osteoarthritis     self-diagnosed    Prediabetes 03/06/2014    Sinusitis     STD (sexually transmitted disease)     herpes    Tooth sensitivity     Vulvar warts 01/16/2020   [2]   Past Surgical History:  Procedure Laterality Date    PR UPPER GI ENDOSCOPY,DIAGNOSIS N/A 09/26/2015    Procedure: UGI ENDO, INCLUDE ESOPHAGUS, STOMACH, & DUODENUM &/OR JEJUNUM; DX W/WO COLLECTION SPECIMN, BY BRUSH OR WASH;  Surgeon: Sim Legions  Barritt, MD;  [facility];  Service: Gastroenterology    SKIN BIOPSY      WISDOM TOOTH EXTRACTION     [3] [4] [5]   Social History  Socioeconomic History    Marital status: Single    Number of children: 1   Tobacco Use    Smoking status: Some Days     Current packs/day: 0.20     Average packs/day: 0.2 packs/day for 5.0 years (1.0 ttl pk-yrs)     Types: Cigarettes    Smokeless tobacco: Never    Tobacco comments:     4 cigarettes when drinking on the weekends but not during pregnancy   Substance and Sexual Activity    Alcohol use: Not Currently     Comment: weekends when she isn't pregnant    Drug use: Never   Other Topics Concern    Do you use sunscreen? No    Tanning bed use? No    Are you easily burned? Yes    Excessive sun exposure? No    Blistering sunburns? No

## 2023-10-22 ENCOUNTER — Ambulatory Visit: Admit: 2023-10-22 | Payer: Medicaid (Managed Care) | Attending: Internal Medicine | Primary: Internal Medicine

## 2023-10-22 NOTE — Unmapped (Unsigned)
 [place] [facility] CONSULTATION       REFERRING PROVIDER:    Pcp, None Per Patient  [address] [place],  [place] [address]    PRIMARY CARE PROVIDER: [facility] Svc, Charles D       ASSESSMENT:      ***      PLAN:        ***    -rtc *** months    These recommendations, as well as other treatment options were discussed with the {companion:5061::patient} today. Questions were answered.  I gave the patient my contact information with instructions to call if any questions or concerns.  Likewise, the patient???s other care providers should feel free to contact me.    Ozell Hudson, MD, [facility]  Division of Gastroenterology and Hepatology  [employer]              HPI: Shawna Beck is a 28 y.o. female with chronic migraine, HS, obesity, seen in consultation at the request of Dr. Freddrick for evaluation of ***    Seen by our hepatology division once in 2017 mostly for steatosis, but also noted 2y of nausea, vomiting, and epigastric pain. Pain worse after eating, nausea in morning. Daily BMs can be hard or loose. She thought omeprazole 40mg  daily made her epigastric pain worse so stopped after a week. During workup for epigastric pain discovered to have steatosis. She had a history of some binge drinking, but this thought to be mainly driven by MASLD. She did have cholelithiasis. She was sent for EGD, which was normal, but no biopsies taken.    She is referred back in 05/2022 (17 months ago) for dysphagia. She has since had several children, and follows with [place] derm for significant hidradenitis, going through multiple antibiotics as well as Humira  (caused HSV flares), and currently on Cosentyx .      Seen by [place] [facility] and had negative NPL, referred here****        ***  O:  L  D:  C  A:  R:  T:  S:    ***rash  ***weakness  ***surgery  *** meds  *** smoking hx  *** alcohol   *** other substances  ***    ***exam: neck, mouth/oropharnyx, belly        EGD (is this new since last EGD)? Yeast / infectious?? Straight to pH? Barium?****  Re-trial PPI?    PRIOR THERAPIES:     PRIOR DIAGNOSTIC EVALUATION:   04/06/22 pelvic US : IMPRESSION:   1. IUD appears to have the side arms obliquely oriented anteriorly   and posteriorly, located within the myometrium as opposed to being   oriented towards the fallopian tubes.   2. Otherwise normal pelvic ultrasound.     02/26/22 CT A/P: IMPRESSION:   1. Normal appendix.   2. Cholelithiasis, as seen on 04/30/2015 ultrasound. No definite   gallbladder wall inflammatory changes.   3. Diffuse fatty infiltration of the liver.   4. Mild to moderate splenomegaly.   5. Small fat containing umbilical hernia.     09/2015 EGD nl throughout, though no biopsies    08/2015 ASMA 1:20, ANA 1:160; HCV neg, HBV and HAV immune    08/2015 Fibroscan 7.6 kPA    04/2015 RUQ US : IMPRESSION:   Cholelithiasis, without sonographic signs of acute cholecystitis or   biliary dilatation.   Diffuse hepatic steatosis/ hepatocellular disease.     PAST MEDICAL HISTORY:  Past Medical History[1]  PAST SURGICAL HISTORY:  Past Surgical History[2]  MEDICATIONS:    Current Outpatient Medications:     acetaminophen  (TYLENOL ) 325 MG tablet, Take 2 tablets (650 mg total) by mouth every six (6) hours as needed., Disp: 90 tablet, Rfl: 0    acyclovir (ZOVIRAX) 200 MG capsule, Take 1 capsule (200 mg total) by mouth every four (4) hours., Disp: , Rfl:     caffeine  200 mg Tab, Take 1 tablet (200 mg total) by mouth every four (4) hours as needed., Disp: 30 tablet, Rfl: 2    clindamycin  (CLEOCIN  T) 1 % lotion, Apply BID to active areas, Disp: 60 mL, Rfl: 5    clindamycin  (CLEOCIN ) 300 MG capsule, Take 1 capsule (300 mg total) by mouth two (2) times a day., Disp: 60 capsule, Rfl: 3    clobetasol  (TEMOVATE ) 0.05 % ointment, Apply twice daily as spot treatment for up to 5-7 days as needed for HS flares., Disp: 60 g, Rfl: 3    empty container Misc, USE AS DIRECTED, Disp: 1 each, Rfl: 2    ferrous gluconate  324 MG tablet, Take 1 tablet (324 mg total) by mouth in the morning., Disp: 30 tablet, Rfl: 11    ibuprofen  (MOTRIN ) 600 MG tablet, Take 1 tablet (600 mg total) by mouth every six (6) hours., Disp: 90 tablet, Rfl: 0    multivitamin, prenatal, folic acid -iron , 27-1 mg Tab, Take 1 tablet by mouth daily., Disp: 30 tablet, Rfl: 11    omeprazole (PRILOSEC) 40 MG capsule, Take 1 capsule (40 mg total) by mouth., Disp: , Rfl:     rifAMPin  (RIFADIN ) 300 MG capsule, Take 1 capsule (300 mg total) by mouth two (2) times a day., Disp: 60 capsule, Rfl: 3    secukinumab  (COSENTYX  UNOREADY PEN) 300 mg/2 mL PnIj, Inject the contents of 1 pen (300 mg) under the skin on week 0, 1, 2, 3, and 4 for loading dose, Disp: 10 mL, Rfl: 0    secukinumab  (COSENTYX  UNOREADY PEN) 300 mg/2 mL PnIj, Inject the contents of 1 pen (300 mg) under the skin every twenty-eight (28) days. For maintenance dose, Disp: 2 mL, Rfl: 11    Antibiotics[3]      Antibiotics[4]    ALLERGIES:  Patient has no known allergies.    SOCIAL HISTORY:  Social History[5]  FAMILY HISTORY:  family history is not on file.      VITAL SIGNS AND PHYSICAL EXAM:  There were no vitals taken for this visit.  {GI Exam Consult:21023051}  REVIEW OF SYSTEMS:   {[facility] IP GI MND:6959876}           [1]   Past Medical History:  Diagnosis Date    Alcohol abuse, episodic 03/06/2014    Anemia     iron  deficiency anemia (taking iron )    Chiari malformation type I    09/20/2017    Dental caries     Dysrhythmia     iron  is low increasing heart rate according to the patient    Fatty liver     Gastritis     Generalized anxiety disorder with panic attacks 12/17/2017    GERD (gastroesophageal reflux disease)     GERD (gastroesophageal reflux disease) 03/20/2015    Herpes 10/23/2016    Hidradenitis suppurativa 01/30/2015    Migraine     once a month    Obesity     Osteoarthritis     self-diagnosed    Prediabetes 03/06/2014    Sinusitis     STD (sexually transmitted disease)  herpes    Tooth sensitivity     Vulvar warts 01/16/2020   [2]   Past Surgical History:  Procedure Laterality Date    PR UPPER GI ENDOSCOPY,DIAGNOSIS N/A 09/26/2015    Procedure: UGI ENDO, INCLUDE ESOPHAGUS, STOMACH, & DUODENUM &/OR JEJUNUM; DX W/WO COLLECTION SPECIMN, BY BRUSH OR WASH;  Surgeon: Sim Donna Magnuson, MD;  Location: [facility];  Service: Gastroenterology    SKIN BIOPSY      WISDOM TOOTH EXTRACTION     [3] [4] [5]   Social History  Socioeconomic History    Marital status: Single    Number of children: 1   Tobacco Use    Smoking status: Some Days     Current packs/day: 0.20     Average packs/day: 0.2 packs/day for 5.0 years (1.0 ttl pk-yrs)     Types: Cigarettes    Smokeless tobacco: Never    Tobacco comments:     4 cigarettes when drinking on the weekends but not during pregnancy   Substance and Sexual Activity    Alcohol use: Not Currently     Comment: weekends when she isn't pregnant    Drug use: Never   Other Topics Concern    Do you use sunscreen? No    Tanning bed use? No    Are you easily burned? Yes    Excessive sun exposure? No    Blistering sunburns? No

## 2023-10-26 NOTE — Unmapped (Signed)
 Shawna Beck 's Cosentyx  shipment will be delayed as a result of credit card ending in [phone] declined     I have reached out to the patient  at [phone] and communicated the delay. We will wait for a call back from the patient to reschedule the delivery.  We have not confirmed the new delivery date.

## 2023-11-03 NOTE — Unmapped (Signed)
 Shawna Beck 's Cosentyx  shipment will be canceled as a result of credit card ending in 5297 declined     I have reached out to the patient  at [phone] and was unable to leave a message. We will not reschedule the medication and have removed this/these medication(s) from the work request.  We have canceled this work request.

## 2023-11-10 NOTE — Unmapped (Signed)
 [facility] and [facility] Delivery Pharm[facility] Refill Coordination Note    Specialty Medication(s) to be Shipped:   Inflammatory Disorders: Cosentyx     Other medication(s) to be shipped: No additional medications requested for fill at this time    Specialty Medications not needed at this time: N/A     Shawna Beck, DOB: 02-Feb-1996  Phone: There are no phone numbers on file.      All above HIPAA information was verified with patient.     Was a Nurse, learning disability used for this call? No    Completed refill call assessment today to schedule patient's medication shipment from the [facility] and Home Delivery Pharmacy  [phone]).  All relevant notes have been reviewed.     Specialty medication(s) and dose(s) confirmed: Regimen is correct and unchanged.   Changes to medications: Shonte reports no changes at this time.  Changes to insurance: No  New side effects reported not previously addressed with a pharmacist or physician: None reported  Questions for the pharmacist: No    Confirmed patient received a Conservation officer, historic buildings and a Surveyor, mining with first shipment. The patient will receive a drug information handout for each medication shipped and additional FDA Medication Guides as required.       DISEASE/MEDICATION-SPECIFIC INFORMATION        For patients on injectable medications: Next injection is scheduled for once received.    SPECIALTY MEDICATION ADHERENCE     Medication Adherence    Patient reported X missed doses in the last month: 1  Specialty Medication: Cosentyx   Patient is on additional specialty medications: No              Were doses missed due to medication being on hold? No    Cosentyx  300/2 mg/ml: 0 doses of medicine on hand        REFERRAL TO PHARMACIST     Referral to the pharmacist: Not needed      [facility]     Shipping address confirmed in Epic.     Cost and Payment: Patient has a copay of $4. They are aware and have authorized the pharmacy to charge the credit card on file.    Delivery Scheduled: Yes, Expected medication delivery date: 11/11/23.     Medication will be delivered via Same Day Courier to the prescription address in Epic [place].    Kelly CHRISTELLA Eagles   [facility] and Home Delivery Pharm[facility]  Specialty Technician

## 2023-11-10 NOTE — Unmapped (Signed)
 [place] [facility] - Clinical Pharmacist Intervention   Reason for Intervention Issue Observed Medication allergy or adverse drug reaction (ADR)          Additional Comments Pt had question about L knee pain and some fluid/swelling and whether or not Cosentyx  could be implicated. We reviewed likely not - she's not experiencing pain/swelling in other joints.There are no sxs of DVT. I encouraged follow up with ortho or PCP.   Intervention Type of Intervention Education performed                  Recommendation provided, if applicable      Medication Medication(s) Involved Cosentyx    Outcome Expected Result/Outcome of Intervention Enhanced patient understanding    Additional Comments      Follow-up Needed?      Additional Follow-up Comments     Supporting Information Approximate Time Spent on Intervention 11-20 minutes    Clinical Evidence Used Professional judgement

## 2024-01-27 NOTE — Progress Notes (Signed)
 [facility] Specialty and Home Delivery Pharmacy Refill Coordination Note    Specialty Medication(s) to be Shipped:   Inflammatory Disorders: Cosentyx     Other medication(s) to be shipped: No additional medications requested for fill at this time    Specialty Medications not needed at this time: N/A     Shawna Beck, DOB: 1995-04-28  Phone: There are no phone numbers on file.      All above HIPAA information was verified with patient.     Was a nurse, learning disability used for this call? No    Completed refill call assessment today to schedule patient's medication shipment from the [facility] and Home Delivery Pharmacy  [phone]).  All relevant notes have been reviewed.     Specialty medication(s) and dose(s) confirmed: Regimen is correct and unchanged.   Changes to medications: [name] reports no changes at this time.  Changes to insurance: No  New side effects reported not previously addressed with a pharmacist or physician: None reported  Questions for the pharmacist: No    Confirmed patient received a Conservation Officer, Historic Buildings and a Surveyor, Mining with first shipment. The patient will receive a drug information handout for each medication shipped and additional FDA Medication Guides as required.       DISEASE/MEDICATION-SPECIFIC INFORMATION        For patients on injectable medications: Next injection is scheduled for 02/15/24.    SPECIALTY MEDICATION ADHERENCE     Medication Adherence    Patient reported X missed doses in the last month: 0  Specialty Medication: COSENTYX  UNOREADY PEN 300 mg/2 mL Pnij (secukinumab )  Patient is on additional specialty medications: No              Were doses missed due to medication being on hold? No   COSENTYX  UNOREADY PEN 300 mg/2 mL Pnij (secukinumab ): 0 days of medicine on hand       REFERRAL TO PHARMACIST     Referral to the pharmacist: Not needed      [facility]     Shipping address confirmed in Epic.     Cost and Payment: Patient has a copay of $4. They are aware and have authorized the pharmacy to charge the credit card on file.    Delivery Scheduled: Yes, Expected medication delivery date: 02/07/24.     Medication will be delivered via Same Day Courier to the prescription address in Epic [place].    Shawna Beck   [facility] Home Delivery Pharmacy  Specialty Technician

## 2024-02-07 MED FILL — COSENTYX UNOREADY PEN 300 MG/2 ML SUBCUTANEOUS PEN INJECTOR: SUBCUTANEOUS | 84 days supply | Qty: 6 | Fill #1
